# Patient Record
Sex: Male | Born: 1985 | Race: White | Hispanic: No | Marital: Single | Smoking: Former smoker
Health system: Southern US, Community
[De-identification: ages and names within clinical notes are randomized; demographics above are authoritative.]

## PROBLEM LIST (undated history)

## (undated) DIAGNOSIS — F418 Other specified anxiety disorders: Secondary | ICD-10-CM

## (undated) DIAGNOSIS — K219 Gastro-esophageal reflux disease without esophagitis: Secondary | ICD-10-CM

## (undated) DIAGNOSIS — N200 Calculus of kidney: Secondary | ICD-10-CM

## (undated) HISTORY — DX: Other specified anxiety disorders: F41.8

## (undated) HISTORY — DX: Gastro-esophageal reflux disease without esophagitis: K21.9

---
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
## 2008-10-05 ENCOUNTER — Emergency Department (HOSPITAL_COMMUNITY): Admission: EM | Admit: 2008-10-05 | Discharge: 2008-10-05 | Payer: Self-pay | Admitting: Emergency Medicine

## 2010-06-12 LAB — URINALYSIS, ROUTINE W REFLEX MICROSCOPIC
Bilirubin Urine: NEGATIVE
Ketones, ur: 15 mg/dL — AB
Protein, ur: NEGATIVE mg/dL
Urobilinogen, UA: 1 mg/dL (ref 0.0–1.0)

## 2010-06-12 LAB — COMPREHENSIVE METABOLIC PANEL
ALT: 16 U/L (ref 0–53)
Albumin: 4.4 g/dL (ref 3.5–5.2)
Alkaline Phosphatase: 52 U/L (ref 39–117)
Chloride: 105 mEq/L (ref 96–112)
Potassium: 3.5 mEq/L (ref 3.5–5.1)
Sodium: 137 mEq/L (ref 135–145)
Total Bilirubin: 1.1 mg/dL (ref 0.3–1.2)
Total Protein: 7.1 g/dL (ref 6.0–8.3)

## 2010-06-12 LAB — CBC
Hemoglobin: 15.7 g/dL (ref 13.0–17.0)
Platelets: 200 10*3/uL (ref 150–400)
RDW: 12.8 % (ref 11.5–15.5)
WBC: 9.3 10*3/uL (ref 4.0–10.5)

## 2010-06-12 LAB — DIFFERENTIAL
Basophils Relative: 1 % (ref 0–1)
Eosinophils Absolute: 0.2 10*3/uL (ref 0.0–0.7)
Eosinophils Relative: 2 % (ref 0–5)
Monocytes Absolute: 0.8 10*3/uL (ref 0.1–1.0)
Monocytes Relative: 8 % (ref 3–12)

## 2010-06-12 LAB — URINE MICROSCOPIC-ADD ON

## 2010-07-05 HISTORY — PX: FINGER SURGERY: SHX640

## 2010-07-26 ENCOUNTER — Ambulatory Visit (HOSPITAL_BASED_OUTPATIENT_CLINIC_OR_DEPARTMENT_OTHER)
Admission: RE | Admit: 2010-07-26 | Discharge: 2010-07-26 | Disposition: A | Discharge status: Home / Self Care | Payer: Self-pay | Source: Ambulatory Visit | Attending: Orthopedic Surgery | Admitting: Orthopedic Surgery

## 2010-07-26 DIAGNOSIS — Y998 Other external cause status: Secondary | ICD-10-CM | POA: Insufficient documentation

## 2010-07-26 DIAGNOSIS — S65509A Unspecified injury of blood vessel of unspecified finger, initial encounter: Secondary | ICD-10-CM | POA: Insufficient documentation

## 2010-07-26 DIAGNOSIS — F17200 Nicotine dependence, unspecified, uncomplicated: Secondary | ICD-10-CM | POA: Insufficient documentation

## 2010-07-26 DIAGNOSIS — Y9389 Activity, other specified: Secondary | ICD-10-CM | POA: Insufficient documentation

## 2010-07-26 DIAGNOSIS — S61209A Unspecified open wound of unspecified finger without damage to nail, initial encounter: Secondary | ICD-10-CM | POA: Insufficient documentation

## 2010-07-26 DIAGNOSIS — IMO0002 Reserved for concepts with insufficient information to code with codable children: Secondary | ICD-10-CM | POA: Insufficient documentation

## 2010-07-26 DIAGNOSIS — W268XXA Contact with other sharp object(s), not elsewhere classified, initial encounter: Secondary | ICD-10-CM | POA: Insufficient documentation

## 2010-07-27 LAB — POCT HEMOGLOBIN-HEMACUE: Hemoglobin: 15.3 g/dL (ref 13.0–17.0)

## 2010-07-29 NOTE — Op Note (Signed)
NAMESY, SAINTJEAN NO.:  [id number]  MEDICAL RECORD NO.:  [id number]           PATIENT TYPE:  LOCATION:                                 FACILITY:  PHYSICIAN:  Betha Loa, MD             DATE OF BIRTH:  DATE OF PROCEDURE:  07/26/2010 DATE OF DISCHARGE:                              OPERATIVE REPORT   PREOPERATIVE DIAGNOSES:  Left long finger flexor tendon laceration, possible digital nerve and artery laceration.  POSTOPERATIVE DIAGNOSES:  Left long finger flexor digitorum profundus laceration, radial digital nerve and artery lacerations.  PROCEDURE:  Left long finger irrigation and debridement of wound, repair of FDP in zone 2, repair of radial digital nerve and artery lacerations.  SURGEON:  Betha Loa, MD  ASSISTANT:  Cindee Salt, MD  ANESTHESIA:  General with regional IV fluids per anesthesia flow sheet.  ESTIMATED BLOOD LOSS:  Minimal.  COMPLICATIONS:  None.  SPECIMENS:  None.  TOURNIQUET TIME:  69 minutes.  DISPOSITION:  Stable to PACU.  INDICATIONS:  Mr. Pullin is a 25 year old white male who 5 days ago was moving a refrigerator when he cut his left long finger on a piece of sheet metal on the refrigerator.  He was went to the [facility] ED where his wound was irrigated and debrided.  He was felt to have a flexor tendon laceration.  I was consulted for management of injury.  He was placed on Bactrim and followed up with me the following day.  On examination, he had intact capillary refill on the left long fingertip. He had decreased sensation on the radial side and normal sensation on the ulnar side.  He was unable to flex at the DIP joint.  He had weak flexion at the PIP joint.  I recommended to Mr. Hannen going to the operating room for irrigation and debridement of the wound, repair of likely flexor tendon laceration and possible radial digital nerve and artery lacerations.  Risks, benefits and alternatives of surgery were discussed  including the risk of blood loss, infection, damage to nerves, vessels, tendons,  ligaments, bone, failure to procedure, need for additional procedure, complications with wound healing, continued pain, and stiffness.  He voiced understanding these risks and elected to proceed.  OPERATIVE COURSE:  After being identified preoperatively by myself, the patient and I agreed upon procedure and site of procedure.  The surgical site was marked.  The risks, benefits and alternatives of surgery were reviewed and he wished to proceed.  Surgical consent had been signed. He was given 1 g of IV Ancef as preoperative antibiotic prophylaxis.  He was given a regional block by Anesthesia in preoperative holding.  He was transferred to the operating room and placed on the operating room table in supine position with the left upper extremity on arm board. General anesthesia was induced by the anesthesiologist.  Left upper extremity was prepped and draped in normal sterile orthopedic fashion. A surgical pause was performed between the surgeons, Anesthesia, and operating room staff and our agreement is the patient, procedure and site of procedure.  Tourniquet at the proximal aspect of the extremity was inflated to 250 mmHg after exsanguinated of the limb with an Esmarch bandage.  The sutures from the wound had been removed prior to prepping and draping.  The wound was extended in a Brunner fashion.  Care was taken to protect all neurovascular structures throughout the case. There was noted to be a laceration of the FDP tendon as well as through the A4 pulley transversely.  The radial digital nerve was 100% lacerated.  There was a apparent laceration to the artery as well. Attention was turned to the flexor tendon first.  A 6-0 Mersilene suture was used to repair the laceration of the A4 pulley.  The cruciate pulleys at the DIP joint were released.  The proximal aspect of the tendon was able to be brought out  from the flexor sheath proximal to the A4 pulley.  The vinculum was still intact.  A 4-0 Mersilene suture was used in a modified Kessler fashion.  The sutures were thrown in the proximal aspect of the tendon first.  The tendon was then able to be brought back through the flexor sheath, now distal to the A4 pulley. The remaining portion of the Kessler suture was placed in the distal stump of the tendon.  This approximated the tendon edges well.  A second modified Kessler core suture was thrown making a four-strand repair. Again, the tendon edges were approximated well.  A 6-0 Prolene suture was used as a running epitendinous suture on the volar aspect of the tendon.  This kept the tendon edges well approximated without any elephant footing.  Attention was turned to the neurovascular bundle on the radial side.  The microscope was brought in.  Under microscopic examination, the radial nerve was confirmed to be 100% lacerated.  There was a small laceration to the radial digital artery.  This went through the media, but not through the intima.  There was no clot in the artery. A 9-0 nylon suture was used to place a single stitch in the media of the artery to prevent any aneurysmal changes.  The edges of the digital nerve was then approximated using the 9-0 suture as well.  This was done in an interrupted fashion.  Four sutures were placed lightly approximating the edges of the nerve.  The wound was then copiously irrigated with sterile saline.  The skin was repaired with 4-0 nylon in a horizontal mattress fashion.  The wound was then dressed with sterile Xeroform and 4x4s, and wrapped with Kerlix bandage.  A dorsal blocking splint was placed and wrapped with Kerlix and Ace bandage.  The tourniquet was deflated at 69 minutes.  Fingertips were pink with brisk capillary refill after deflation of the tourniquet.  The operative drapes were broken down.  The patient was awakened from  anesthesia safely.  He was transferred back to stretcher and taken to PACU in stable condition.  I will see him back in the office in 1 week for postoperative followup.  He will finish off his Bactrim prescription from last week.  I will give him Percocet 5/325 one to two p.o. q.6 h. p.r.n. pain, dispensed #50.     Betha Loa, MD     KK/MEDQ  D:  07/26/2010  T:  07/27/2010  Job:  [id number]  Electronically Signed by Betha Loa  on 07/29/2010 04:18:57 PM

## 2013-12-27 ENCOUNTER — Encounter (HOSPITAL_COMMUNITY): Payer: Self-pay | Admitting: Emergency Medicine

## 2013-12-27 ENCOUNTER — Emergency Department (HOSPITAL_COMMUNITY)
Admission: EM | Admit: 2013-12-27 | Discharge: 2013-12-27 | Disposition: A | Discharge status: Home / Self Care | Payer: Self-pay | Attending: Emergency Medicine | Admitting: Emergency Medicine

## 2013-12-27 DIAGNOSIS — K047 Periapical abscess without sinus: Secondary | ICD-10-CM | POA: Insufficient documentation

## 2013-12-27 DIAGNOSIS — K029 Dental caries, unspecified: Secondary | ICD-10-CM | POA: Insufficient documentation

## 2013-12-27 DIAGNOSIS — Z720 Tobacco use: Secondary | ICD-10-CM | POA: Insufficient documentation

## 2013-12-27 MED ORDER — TRAMADOL HCL 50 MG PO TABS: 50.0000 mg | ORAL_TABLET | Freq: Four times a day (QID) | ORAL | Status: DC | PRN

## 2013-12-27 MED ORDER — AMOXICILLIN 500 MG PO CAPS: 500.0000 mg | ORAL_CAPSULE | Freq: Three times a day (TID) | ORAL | Status: DC

## 2013-12-27 MED ORDER — AMOXICILLIN 500 MG PO CAPS
500.0000 mg | ORAL_CAPSULE | Freq: Once | ORAL | Status: AC
  Administered 2013-12-27: 500 mg via ORAL
  Filled 2013-12-27: qty 1

## 2013-12-27 MED ORDER — NAPROXEN 500 MG PO TABS: 500.0000 mg | ORAL_TABLET | Freq: Two times a day (BID) | ORAL | Status: DC

## 2013-12-27 NOTE — ED Provider Notes (Signed)
CSN: [id number]     Arrival date & time 12/27/13  0411 History   First MD Initiated Contact with Patient 12/27/13 0545     Chief Complaint  Patient presents with  . Dental Pain     (Consider location/radiation/quality/duration/timing/severity/associated sxs/prior Treatment) HPI Comments: Patient presents with complaint of dental pain. Patient has had right upper dental pain over the past one to 2 weeks. No throat swelling or trouble swallowing. No fever or trouble breathing. [facility] and he states he is going through the process of receiving care there. They had placed him on amoxicillin which he took until approximately one month prior. No other treatments prior to arrival. Onset of symptoms gradual. Course is constant.  Patient is a 28 y.o. male presenting with tooth pain. The history is provided by the patient.  Dental Pain Associated symptoms: no facial swelling, no fever, no headaches and no neck pain     History reviewed. No pertinent past medical history. Past Surgical History  Procedure Laterality Date  . Finger surgery    . Hernia repair     No family history on file. History  Substance Use Topics  . Smoking status: Current Every Day Smoker  . Smokeless tobacco: Not on file  . Alcohol Use: Yes    Review of Systems  Constitutional: Negative for fever.  HENT: Positive for dental problem. Negative for ear pain, facial swelling, sore throat and trouble swallowing.   Respiratory: Negative for shortness of breath and stridor.   Musculoskeletal: Negative for neck pain.  Skin: Negative for color change.  Neurological: Negative for headaches.      Allergies  Review of patient's allergies indicates no known allergies.  Home Medications   Prior to Admission medications   Medication Sig Start Date End Date Taking? Authorizing Provider  ibuprofen (ADVIL,MOTRIN) 200 MG tablet Take 400-600 mg by mouth every 6 (six) hours as needed for  moderate pain.   Yes Historical Provider, MD  Multiple Vitamin (MULTIVITAMIN WITH MINERALS) TABS tablet Take 1 tablet by mouth daily.   Yes Historical Provider, MD  amoxicillin (AMOXIL) 500 MG capsule Take 1 capsule (500 mg total) by mouth 3 (three) times daily. 12/27/13   Renne CriglerJoshua Muneer Leider, PA-C  naproxen (NAPROSYN) 500 MG tablet Take 1 tablet (500 mg total) by mouth 2 (two) times daily. 12/27/13   Renne CriglerJoshua Tereka Thorley, PA-C  traMADol (ULTRAM) 50 MG tablet Take 1 tablet (50 mg total) by mouth every 6 (six) hours as needed. 12/27/13   Renne CriglerJoshua Micaiah Remillard, PA-C   BP 112/57  Pulse 63  Temp(Src) 97.9 F (36.6 C) (Oral)  Resp 16  Ht 5\' 5"  (1.651 m)  Wt 140 lb (63.504 kg)  BMI 23.30 kg/m2  SpO2 99% Physical Exam  Nursing note and vitals reviewed. Constitutional: He appears well-developed and well-nourished.  HENT:  Head: Normocephalic and atraumatic.  Right Ear: Tympanic membrane, external ear and ear canal normal.  Left Ear: Tympanic membrane, external ear and ear canal normal.  Nose: Nose normal.  Mouth/Throat: Uvula is midline, oropharynx is clear and moist and mucous membranes are normal. No trismus in the jaw. Abnormal dentition. Dental caries present. No dental abscesses or uvula swelling. No tonsillar abscesses.  Advanced periodontal disease. Extensive cavities noted. Multiple broken teeth. Patient with R maxillary tooth pain and tenderness to palpation in area of bicuspid. There is a pustule on the gumline in this area.  Eyes: Pupils are equal, round, and reactive to light.  Neck:  Normal range of motion. Neck supple.  No neck swelling or Lugwig's angina  Neurological: He is alert.  Skin: Skin is warm and dry.  Psychiatric: He has a normal mood and affect.    ED Course  Procedures (including critical care time) Labs Review Labs Reviewed - No data to display  Imaging Review No results found.   EKG Interpretation None      7:24 AM Patient seen and examined. Medications ordered.   Vital  signs reviewed and are as follows: BP 112/57  Pulse 63  Temp(Src) 97.9 F (36.6 C) (Oral)  Resp 16  Ht 5\' 5"  (1.651 m)  Wt 140 lb (63.504 kg)  BMI 23.30 kg/m2  SpO2 99%  Patient counseled on use of narcotic pain medications. Counseled not to combine these medications with others containing tylenol. Urged not to drink alcohol, drive, or perform any other activities that requires focus while taking these medications. The patient verbalizes understanding and agrees with the plan.  Patient counseled to take prescribed medications as directed, return with worsening facial or neck swelling, and to follow-up with their dentist as soon as possible.    MDM   Final diagnoses:  Dental abscess   Patient with toothache. No fever. Exam unconcerning for Ludwig's angina or other deep tissue infection in neck.   As there is gum swelling, erythema, and mild facial swelling, will treat with antibiotic and pain medicine. Urged patient to follow-up with dentist.       Renne CriglerJoshua Patrena Santalucia, PA-C 12/27/13 [phone]

## 2013-12-27 NOTE — Discharge Instructions (Signed)
Please read and follow all provided instructions.  Your diagnoses today include:  1. Dental abscess     The exam and treatment you received today has been provided on an emergency basis only. This is not a substitute for complete medical or dental care.  Tests performed today include:  Vital signs. See below for your results today.   Medications prescribed:   Amoxicillin - antibiotic  You have been prescribed an antibiotic medicine: take the entire course of medicine even if you are feeling better. Stopping early can cause the antibiotic not to work.   Tramadol - narcotic-like pain medication  DO NOT drive or perform any activities that require you to be awake and alert because this medicine can make you drowsy.    Naproxen - anti-inflammatory pain medication  Do not exceed 500mg  naproxen every 12 hours, take with food  You have been prescribed an anti-inflammatory medication or NSAID. Take with food. Take smallest effective dose for the shortest duration needed for your pain. Stop taking if you experience stomach pain or vomiting.   Take any prescribed medications only as directed.  Home care instructions:  Follow any educational materials contained in this packet.  Follow-up instructions: Please follow-up with your dentist for further evaluation of your symptoms.   Dental Assistance: See below for dental referrals  Return instructions:   Please return to the Emergency Department if you experience worsening symptoms.  Please return if you develop a fever, you develop more swelling in your face or neck, you have trouble breathing or swallowing food.  Please return if you have any other emergent concerns.  Additional Information:  Your vital signs today were: BP 112/57   Pulse 63   Temp(Src) 97.9 F (36.6 C) (Oral)   Resp 16   Ht 5\' 5"  (1.651 m)   Wt 140 lb (63.504 kg)   BMI 23.30 kg/m2   SpO2 99% If your blood pressure (BP) was elevated above 135/85 this visit,  please have this repeated by your doctor within one month. -------------- Dental Care: Organization         Address  Phone  Notes  [facility] [facility] [address], [place] [phone] Accepts children up to age 26 who are enrolled in [place] or [place] Health Choice; pregnant women with a Medicaid card; and children who have applied for Medicaid or [place] Health Choice, but were declined, whose parents can pay a reduced fee at time of service.  [facility] [place]  [address], [place] [phone] Accepts children up to age 26 who are enrolled in [place] or [place] Health Choice; pregnant women with a Medicaid card; and children who have applied for Medicaid or [place] Health Choice, but were declined, whose parents can pay a reduced fee at time of service.  [facility]  [address], [place] [phone] Patients are seen by appointment only. Walk-ins are not accepted. [facility] will see patients 418 years of age and older. Monday - Tuesday (8am-5pm) Most Wednesdays (8:30-5pm) $30 per visit, cash only  [facility]  [address], [facility] [phone] Patients are seen by appointment only. Walk-ins are not accepted. [facility] will see patients 26 years of age and older. One Wednesday Evening (Monthly: Volunteer Based).  $30 per visit, cash only  [facility]  [phone] for adults; Children  under age 524, call Graduate Pediatric Dentistry at [phone]. Children aged 754-14, please call [phone] to request a pediatric application.  Dental services are provided in all areas of dental care including fillings, crowns and bridges, complete and partial dentures, implants, gum treatment, root canals, and extractions. Preventive care is also provided. Treatment is provided to both adults and  children. Patients are selected via a lottery and there is often a waiting list.   [facility] [address], [place]  [phone] [email or web address]   [facility] [address], [place], [place] [phone], Ext. [phone] Second and Fourth Thursday of each month, opens at 6:30 AM; Clinic ends at 9 AM.  Patients are seen on a first-come first-served basis, and a limited number are seen during each clinic.   [facility]  [address], [place], [place] [phone]   Eligibility Requirements You must have lived in [place], [place], or [place] for at least the last three months.   You cannot be eligible for state or federal sponsored [place]care insurance, including [employer], [place], or [employer]icare.   You generally cannot be eligible for healthcare insurance through your employer.    How to apply: Eligibility screenings are held every Tuesday and Wednesday afternoon from 1:00 pm until 4:00 pm. You do not need an appointment for the interview!  [facility] [address], [place], [place] [phone]   [facility]  [phone]   [facility]  [phone]   [facility]  [phone]

## 2013-12-27 NOTE — ED Notes (Signed)
Bed: WTR8 Expected date:  Expected time:  Means of arrival:  Comments: 

## 2013-12-27 NOTE — ED Provider Notes (Signed)
Medical screening examination/treatment/procedure(s) were performed by non-physician practitioner and as supervising physician I was immediately available for consultation/collaboration.   EKG Interpretation None        Rolan BuccoMelanie Dontavis Tschantz, MD 12/27/13 [phone]

## 2013-12-27 NOTE — ED Notes (Signed)
Pt c/o R upper gum/dental pain x 1-2 weeks. Pt states [facility] now notes a "puss pocket" on gum with severe pain.

## 2015-02-04 ENCOUNTER — Encounter: Payer: Self-pay | Admitting: Internal Medicine

## 2015-03-11 ENCOUNTER — Encounter: Payer: Self-pay | Admitting: *Deleted

## 2015-04-06 ENCOUNTER — Ambulatory Visit: Payer: Self-pay | Admitting: Internal Medicine

## 2015-04-12 ENCOUNTER — Ambulatory Visit (HOSPITAL_BASED_OUTPATIENT_CLINIC_OR_DEPARTMENT_OTHER): Payer: Self-pay

## 2016-07-31 ENCOUNTER — Encounter (HOSPITAL_COMMUNITY): Payer: Self-pay | Admitting: Family Medicine

## 2016-07-31 ENCOUNTER — Emergency Department (HOSPITAL_COMMUNITY)
Admission: EM | Admit: 2016-07-31 | Discharge: 2016-08-01 | Disposition: A | Discharge status: Home / Self Care | Payer: Self-pay | Attending: Emergency Medicine | Admitting: Emergency Medicine

## 2016-07-31 ENCOUNTER — Emergency Department (HOSPITAL_COMMUNITY): Payer: Self-pay

## 2016-07-31 DIAGNOSIS — R112 Nausea with vomiting, unspecified: Secondary | ICD-10-CM | POA: Insufficient documentation

## 2016-07-31 DIAGNOSIS — Z79899 Other long term (current) drug therapy: Secondary | ICD-10-CM | POA: Insufficient documentation

## 2016-07-31 DIAGNOSIS — N200 Calculus of kidney: Secondary | ICD-10-CM

## 2016-07-31 DIAGNOSIS — N132 Hydronephrosis with renal and ureteral calculous obstruction: Secondary | ICD-10-CM | POA: Insufficient documentation

## 2016-07-31 DIAGNOSIS — R1110 Vomiting, unspecified: Secondary | ICD-10-CM

## 2016-07-31 DIAGNOSIS — F17210 Nicotine dependence, cigarettes, uncomplicated: Secondary | ICD-10-CM | POA: Insufficient documentation

## 2016-07-31 HISTORY — DX: Calculus of kidney: N20.0

## 2016-07-31 LAB — COMPREHENSIVE METABOLIC PANEL
ALT: 16 U/L — AB (ref 17–63)
AST: 21 U/L (ref 15–41)
Albumin: 4.4 g/dL (ref 3.5–5.0)
Alkaline Phosphatase: 60 U/L (ref 38–126)
Anion gap: 11 (ref 5–15)
BILIRUBIN TOTAL: 0.4 mg/dL (ref 0.3–1.2)
BUN: 23 mg/dL — ABNORMAL HIGH (ref 6–20)
CHLORIDE: 106 mmol/L (ref 101–111)
CO2: 22 mmol/L (ref 22–32)
CREATININE: 1.12 mg/dL (ref 0.61–1.24)
Calcium: 9.5 mg/dL (ref 8.9–10.3)
Glucose, Bld: 109 mg/dL — ABNORMAL HIGH (ref 65–99)
Potassium: 3.5 mmol/L (ref 3.5–5.1)
Sodium: 139 mmol/L (ref 135–145)
TOTAL PROTEIN: 7.5 g/dL (ref 6.5–8.1)

## 2016-07-31 LAB — CBC
HCT: 45.1 % (ref 39.0–52.0)
Hemoglobin: 16 g/dL (ref 13.0–17.0)
MCH: 29.5 pg (ref 26.0–34.0)
MCHC: 35.5 g/dL (ref 30.0–36.0)
MCV: 83.1 fL (ref 78.0–100.0)
PLATELETS: 284 10*3/uL (ref 150–400)
RBC: 5.43 MIL/uL (ref 4.22–5.81)
RDW: 13.2 % (ref 11.5–15.5)
WBC: 10.1 10*3/uL (ref 4.0–10.5)

## 2016-07-31 LAB — URINALYSIS, ROUTINE W REFLEX MICROSCOPIC
BILIRUBIN URINE: NEGATIVE
Bacteria, UA: NONE SEEN
GLUCOSE, UA: NEGATIVE mg/dL
KETONES UR: 5 mg/dL — AB
NITRITE: NEGATIVE
PH: 7 (ref 5.0–8.0)
Protein, ur: 30 mg/dL — AB
SPECIFIC GRAVITY, URINE: 1.029 (ref 1.005–1.030)

## 2016-07-31 LAB — LIPASE, BLOOD: LIPASE: 28 U/L (ref 11–51)

## 2016-07-31 LAB — RAPID URINE DRUG SCREEN, HOSP PERFORMED
Amphetamines: NOT DETECTED
BARBITURATES: NOT DETECTED
Benzodiazepines: NOT DETECTED
Cocaine: NOT DETECTED
Opiates: NOT DETECTED
TETRAHYDROCANNABINOL: POSITIVE — AB

## 2016-07-31 MED ORDER — ONDANSETRON HCL 4 MG/2ML IJ SOLN
4.0000 mg | Freq: Once | INTRAMUSCULAR | Status: AC
  Administered 2016-07-31: 4 mg via INTRAVENOUS
  Filled 2016-07-31: qty 2

## 2016-07-31 MED ORDER — FENTANYL CITRATE (PF) 100 MCG/2ML IJ SOLN
50.0000 ug | INTRAMUSCULAR | Status: AC | PRN
  Administered 2016-07-31 (×2): 50 ug via INTRAVENOUS
  Filled 2016-07-31 (×2): qty 2

## 2016-07-31 MED ORDER — ONDANSETRON 4 MG PO TBDP
4.0000 mg | ORAL_TABLET | Freq: Once | ORAL | Status: AC | PRN
  Administered 2016-07-31: 4 mg via ORAL
  Filled 2016-07-31: qty 1

## 2016-07-31 MED ORDER — KETOROLAC TROMETHAMINE 30 MG/ML IJ SOLN
30.0000 mg | Freq: Once | INTRAMUSCULAR | Status: AC
  Administered 2016-07-31: 30 mg via INTRAVENOUS
  Filled 2016-07-31: qty 1

## 2016-07-31 MED ORDER — SODIUM CHLORIDE 0.9 % IV BOLUS (SEPSIS)
1000.0000 mL | Freq: Once | INTRAVENOUS | Status: AC
  Administered 2016-07-31: 1000 mL via INTRAVENOUS

## 2016-07-31 NOTE — ED Triage Notes (Signed)
Patient is complaining of right lower abd pain that started last night with increased urinary urgency. Pt has had nausea, vomiting with no fever.

## 2016-07-31 NOTE — ED Notes (Signed)
ED Provider at bedside. 

## 2016-07-31 NOTE — ED Notes (Signed)
Pt aware of need for urine sample.  

## 2016-07-31 NOTE — ED Provider Notes (Signed)
[facility]-EMERGENCY DEPT Provider Note   CSN: [id number] Arrival date & time: 07/31/16  2053  By signing my name below, I, Doreatha Martin, attest that this documentation has been prepared under the direction and in the presence of Johnmichael Melhorn, MD. Electronically Signed: Doreatha Martin, ED Scribe. 07/31/16. 11:17 PM.     History   Chief Complaint Chief Complaint  Patient presents with  . Abdominal Pain    HPI Carl Brown is a 31 y.o. male who presents to the Emergency Department complaining of moderate, waxing and waning RLQ pain with radiation to the mid lower abdomen that began last night and worsened tonight. Pt reports associated nausea and vomiting that began tonight. He has tried advil with no relief. Pt reports h/o renal calculi, which he last had 4 years ago and states his current symptoms are similar. He was seen by UC yesterday and was given possible dx of kidney stones, but did not receive any medications. He has h/o hernia repair. He denies diarrhea, dark urine, additional complaints.   The history is provided by the patient. No language interpreter was used.  Abdominal Pain   This is a new problem. The current episode started yesterday. The problem occurs constantly. The problem has been gradually worsening. The pain is located in the RLQ. The pain is moderate. Associated symptoms include nausea and vomiting. Pertinent negatives include fever and diarrhea. Nothing aggravates the symptoms. Nothing relieves the symptoms. Past workup includes surgery. Past medical history comments: renal calculi.    Past Medical History:  Diagnosis Date  . Depression with anxiety   . GERD (gastroesophageal reflux disease)   . Kidney stone     There are no active problems to display for this patient.   Past Surgical History:  Procedure Laterality Date  . FINGER SURGERY  07/2010  . HERNIA REPAIR  03/1994  . TONSILLECTOMY  03/1997       Home Medications    Prior to Admission medications     Medication Sig Start Date End Date Taking? Authorizing Provider  ibuprofen (ADVIL,MOTRIN) 200 MG tablet Take 400-600 mg by mouth every 6 (six) hours as needed for moderate pain.   Yes [provider]  omeprazole (PRILOSEC OTC) 20 MG tablet Take 20 mg by mouth daily.   Yes [provider]  amoxicillin (AMOXIL) 500 MG capsule Take 1 capsule (500 mg total) by mouth 3 (three) times daily. Patient not taking: Reported on 07/31/2016 12/27/13   Renne Crigler, PA-C  naproxen (NAPROSYN) 500 MG tablet Take 1 tablet (500 mg total) by mouth 2 (two) times daily. Patient not taking: Reported on 07/31/2016 12/27/13   Renne Crigler, PA-C  traMADol (ULTRAM) 50 MG tablet Take 1 tablet (50 mg total) by mouth every 6 (six) hours as needed. Patient not taking: Reported on 07/31/2016 12/27/13   Renne Crigler, PA-C    Family History Family History  Problem Relation Age of Onset  . Lung cancer Mother     Social History Social History  Substance Use Topics  . Smoking status: Current Every Day Smoker    Packs/day: 1.00    Types: Cigarettes  . Smokeless tobacco: Never Used  . Alcohol use 0.0 oz/week     Comment: Twice a month     Allergies   Patient has no known allergies.   Review of Systems Review of Systems  Constitutional: Negative for chills and fever.  HENT: Negative for drooling and facial swelling.   Eyes: Negative for photophobia.  Respiratory: Negative for  shortness of breath.   Cardiovascular: Negative for chest pain, palpitations and leg swelling.  Gastrointestinal: Positive for abdominal pain, nausea and vomiting. Negative for anal bleeding and diarrhea.  Genitourinary: Negative for difficulty urinating.  Musculoskeletal: Negative for neck stiffness.  Skin: Negative for pallor.  Neurological: Negative for facial asymmetry and speech difficulty.  Psychiatric/Behavioral: Negative for suicidal ideas.  All other systems reviewed and are negative.    Physical  Exam Updated Vital Signs BP 122/77 (BP Location: Right Arm)   Pulse 61   Temp 97.6 F (36.4 C) (Oral)   Resp 16   Ht 5\' 6"  (1.676 m)   Wt 150 lb (68 kg)   SpO2 100%   BMI 24.21 kg/m   Physical Exam  Constitutional: He is oriented to person, place, and time. He appears well-developed and well-nourished.  HENT:  Head: Normocephalic and atraumatic.  Mouth/Throat: Oropharynx is clear and moist. No oropharyngeal exudate.  Moist mucous membranes. No exudates.   Eyes: Conjunctivae and EOM are normal. Pupils are equal, round, and reactive to light.  Neck: Normal range of motion. Neck supple. No JVD present. No tracheal deviation present.  No carotid bruits. Trachea midline.   Cardiovascular: Normal rate, regular rhythm, normal heart sounds and intact distal pulses.  Exam reveals no gallop and no friction rub.   No murmur heard. RRR.   Pulmonary/Chest: Effort normal and breath sounds normal. No stridor. No respiratory distress. He has no wheezes. He has no rales.  Lungs CTA bilaterally.   Abdominal: Soft. Bowel sounds are normal. He exhibits no distension. There is no rebound and no guarding.  Musculoskeletal: Normal range of motion.  Lymphadenopathy:    He has no cervical adenopathy.  Neurological: He is alert and oriented to person, place, and time. He has normal reflexes. He displays normal reflexes.  Skin: Skin is warm and dry. Capillary refill takes less than 2 seconds.  Psychiatric: He has a normal mood and affect.  Nursing note and vitals reviewed.    ED Treatments / Results   Vitals:   07/31/16 2321 08/01/16 0144  BP: 122/77 124/65  Pulse: 61 64  Resp: 16 16  Temp:     DIAGNOSTIC STUDIES: Oxygen Saturation is 100% on RA, normal by my interpretation.    COORDINATION OF CARE: 11:06 PM Discussed treatment plan with pt at bedside which includes labs and pt agreed to plan.    Labs (all labs ordered are listed, but only abnormal results are displayed)  Results for  orders placed or performed during the hospital encounter of 07/31/16  Lipase, blood  Result Value Ref Range   Lipase 28 11 - 51 U/L  Comprehensive metabolic panel  Result Value Ref Range   Sodium 139 135 - 145 mmol/L   Potassium 3.5 3.5 - 5.1 mmol/L   Chloride 106 101 - 111 mmol/L   CO2 22 22 - 32 mmol/L   Glucose, Bld 109 (H) 65 - 99 mg/dL   BUN 23 (H) 6 - 20 mg/dL   Creatinine, Ser 1.61 0.61 - 1.24 mg/dL   Calcium 9.5 8.9 - 09.6 mg/dL   Total Protein 7.5 6.5 - 8.1 g/dL   Albumin 4.4 3.5 - 5.0 g/dL   AST 21 15 - 41 U/L   ALT 16 (L) 17 - 63 U/L   Alkaline Phosphatase 60 38 - 126 U/L   Total Bilirubin 0.4 0.3 - 1.2 mg/dL   GFR calc non Af Amer >60 >60 mL/min   GFR calc Af Amer >  60 >60 mL/min   Anion gap 11 5 - 15  CBC  Result Value Ref Range   WBC 10.1 4.0 - 10.5 K/uL   RBC 5.43 4.22 - 5.81 MIL/uL   Hemoglobin 16.0 13.0 - 17.0 g/dL   HCT 16.145.1 09.639.0 - 04.552.0 %   MCV 83.1 78.0 - 100.0 fL   MCH 29.5 26.0 - 34.0 pg   MCHC 35.5 30.0 - 36.0 g/dL   RDW 40.913.2 81.111.5 - 91.415.5 %   Platelets 284 150 - 400 K/uL  Urinalysis, Routine w reflex microscopic  Result Value Ref Range   Color, Urine YELLOW YELLOW   APPearance CLEAR CLEAR   Specific Gravity, Urine 1.029 1.005 - 1.030   pH 7.0 5.0 - 8.0   Glucose, UA NEGATIVE NEGATIVE mg/dL   Hgb urine dipstick MODERATE (A) NEGATIVE   Bilirubin Urine NEGATIVE NEGATIVE   Ketones, ur 5 (A) NEGATIVE mg/dL   Protein, ur 30 (A) NEGATIVE mg/dL   Nitrite NEGATIVE NEGATIVE   Leukocytes, UA MODERATE (A) NEGATIVE   RBC / HPF 0-5 0 - 5 RBC/hpf   WBC, UA 0-5 0 - 5 WBC/hpf   Bacteria, UA NONE SEEN NONE SEEN   Squamous Epithelial / LPF 0-5 (A) NONE SEEN   Mucous PRESENT   Rapid urine drug screen (hospital performed)  Result Value Ref Range   Opiates NONE DETECTED NONE DETECTED   Cocaine NONE DETECTED NONE DETECTED   Benzodiazepines NONE DETECTED NONE DETECTED   Amphetamines NONE DETECTED NONE DETECTED   Tetrahydrocannabinol POSITIVE (A) NONE DETECTED    Barbiturates NONE DETECTED NONE DETECTED   Ct Renal Stone Study  Result Date: 08/01/2016 CLINICAL DATA:  RIGHT lower quadrant pain, nausea and vomiting. History of kidney stones and RIGHT inguinal hernia repair. EXAM: CT ABDOMEN AND PELVIS WITHOUT CONTRAST TECHNIQUE: Multidetector CT imaging of the abdomen and pelvis was performed following the standard protocol without IV contrast. COMPARISON:  CT abdomen and pelvis October 05, 2008 FINDINGS: LOWER CHEST: Lung bases are clear. The visualized heart size is normal. No pericardial effusion. Mild gas distended distal esophagus associated with reflux. HEPATOBILIARY: Normal. PANCREAS: Normal. SPLEEN: Normal. ADRENALS/URINARY TRACT: Kidneys are orthotopic, demonstrating normal size and morphology. Mild RIGHT hydroureteronephrosis to the distal ureter where a 2 mm calculus is present. No nephrolithiasis ; limited assessment for renal masses on this nonenhanced examination. The unopacified ureters are normal in course and caliber. Urinary bladder is partially distended and unremarkable. Normal adrenal glands. STOMACH/BOWEL: The stomach, small and large bowel are normal in course and caliber without inflammatory changes, sensitivity decreased by lack of enteric contrast. Normal appendix. VASCULAR/LYMPHATIC: Aortoiliac vessels are normal in course and caliber. No lymphadenopathy by CT size criteria. REPRODUCTIVE: Normal. OTHER: No intraperitoneal free fluid or free air. Phleboliths LEFT pelvis. MUSCULOSKELETAL: Non-acute.  No inguinal hernia. IMPRESSION: 2 mm distal RIGHT ureteral calculus resulting in mild obstructive uropathy. No residual nephrolithiasis. Normal appendix. Electronically Signed   By: Awilda Metroourtnay  Bloomer M.D.   On: 08/01/2016 00:21    Radiology Ct Renal Stone Study  Result Date: 08/01/2016 CLINICAL DATA:  RIGHT lower quadrant pain, nausea and vomiting. History of kidney stones and RIGHT inguinal hernia repair. EXAM: CT ABDOMEN AND PELVIS WITHOUT  CONTRAST TECHNIQUE: Multidetector CT imaging of the abdomen and pelvis was performed following the standard protocol without IV contrast. COMPARISON:  CT abdomen and pelvis October 05, 2008 FINDINGS: LOWER CHEST: Lung bases are clear. The visualized heart size is normal. No pericardial effusion. Mild gas distended distal esophagus associated  with reflux. HEPATOBILIARY: Normal. PANCREAS: Normal. SPLEEN: Normal. ADRENALS/URINARY TRACT: Kidneys are orthotopic, demonstrating normal size and morphology. Mild RIGHT hydroureteronephrosis to the distal ureter where a 2 mm calculus is present. No nephrolithiasis ; limited assessment for renal masses on this nonenhanced examination. The unopacified ureters are normal in course and caliber. Urinary bladder is partially distended and unremarkable. Normal adrenal glands. STOMACH/BOWEL: The stomach, small and large bowel are normal in course and caliber without inflammatory changes, sensitivity decreased by lack of enteric contrast. Normal appendix. VASCULAR/LYMPHATIC: Aortoiliac vessels are normal in course and caliber. No lymphadenopathy by CT size criteria. REPRODUCTIVE: Normal. OTHER: No intraperitoneal free fluid or free air. Phleboliths LEFT pelvis. MUSCULOSKELETAL: Non-acute.  No inguinal hernia. IMPRESSION: 2 mm distal RIGHT ureteral calculus resulting in mild obstructive uropathy. No residual nephrolithiasis. Normal appendix. Electronically Signed   By: Awilda Metro M.D.   On: 08/01/2016 00:21    Procedures Procedures (including critical care time)  Medications Ordered in ED Medications  magnesium sulfate IVPB 2 g 50 mL (2 g Intravenous New Bag/Given 08/01/16 0058)  tamsulosin (FLOMAX) capsule 0.4 mg (0.4 mg Oral Given 08/01/16 0100)  ondansetron (ZOFRAN-ODT) disintegrating tablet 4 mg (4 mg Oral Given 07/31/16 2118)  fentaNYL (SUBLIMAZE) injection 50 mcg (50 mcg Intravenous Given 07/31/16 2216)  ondansetron (ZOFRAN) injection 4 mg (4 mg Intravenous Given  07/31/16 2146)  ketorolac (TORADOL) 30 MG/ML injection 30 mg (30 mg Intravenous Given 07/31/16 2333)  sodium chloride 0.9 % bolus 1,000 mL (0 mLs Intravenous Stopped 08/01/16 0058)      Final Clinical Impressions(s) / ED Diagnoses  Kidney stones:  Return immediately for fever >101, intractable pain or vomiting, bleeding or any concerns. Follow up with urology for ongoing concerns. scedule an appointment in 1 week.   The patient is nontoxic-appearing on exam and vital signs are within normal limits.   I have reviewed the triage vital signs and the nursing notes. Pertinent labs &imaging results that were available during my care of the patient were reviewed by me and considered in my medical decision making (see chart for details).  After history, exam, and medical workup I feel the patient has been appropriately medically screened and is safe for discharge home. Pertinent diagnoses were discussed with the patient. Patient was given return precautions.   I personally performed the services described in this documentation, which was scribed in my presence. The recorded information has been reviewed and is accurate.      Jeron Grahn, MD 08/01/16 [phone]

## 2016-07-31 NOTE — ED Notes (Signed)
Pt not able to urinate.   

## 2016-08-01 ENCOUNTER — Encounter (HOSPITAL_COMMUNITY): Payer: Self-pay | Admitting: Emergency Medicine

## 2016-08-01 MED ORDER — OXYCODONE-ACETAMINOPHEN 5-325 MG PO TABS: 1.0000 | ORAL_TABLET | Freq: Four times a day (QID) | ORAL | 0 refills | Status: DC | PRN

## 2016-08-01 MED ORDER — MAGNESIUM SULFATE 2 GM/50ML IV SOLN
2.0000 g | Freq: Once | INTRAVENOUS | Status: AC
  Administered 2016-08-01: 2 g via INTRAVENOUS
  Filled 2016-08-01: qty 50

## 2016-08-01 MED ORDER — ONDANSETRON 8 MG PO TBDP: ORAL_TABLET | ORAL | 0 refills | Status: DC

## 2016-08-01 MED ORDER — TAMSULOSIN HCL 0.4 MG PO CAPS
0.4000 mg | ORAL_CAPSULE | Freq: Every day | ORAL | Status: DC
  Administered 2016-08-01: 0.4 mg via ORAL
  Filled 2016-08-01: qty 1

## 2016-08-01 MED ORDER — TAMSULOSIN HCL 0.4 MG PO CAPS: 0.4000 mg | ORAL_CAPSULE | Freq: Every day | ORAL | 0 refills | Status: DC

## 2016-08-01 NOTE — ED Notes (Signed)
Pt tolerated PO liquid with no issues. MD made aware.

## 2016-08-01 NOTE — ED Notes (Signed)
MD at bedside. 

## 2021-06-27 ENCOUNTER — Encounter (HOSPITAL_COMMUNITY): Payer: Self-pay | Admitting: Emergency Medicine

## 2021-06-27 ENCOUNTER — Other Ambulatory Visit: Payer: Self-pay

## 2021-06-27 ENCOUNTER — Emergency Department (HOSPITAL_COMMUNITY): Payer: Self-pay

## 2021-06-27 ENCOUNTER — Emergency Department (HOSPITAL_COMMUNITY)
Admission: EM | Admit: 2021-06-27 | Discharge: 2021-06-28 | Disposition: A | Discharge status: Home / Self Care | Payer: Self-pay | Attending: Emergency Medicine | Admitting: Emergency Medicine

## 2021-06-27 DIAGNOSIS — R079 Chest pain, unspecified: Secondary | ICD-10-CM | POA: Insufficient documentation

## 2021-06-27 DIAGNOSIS — N200 Calculus of kidney: Secondary | ICD-10-CM | POA: Insufficient documentation

## 2021-06-27 LAB — CBC
HCT: 46.4 % (ref 39.0–52.0)
Hemoglobin: 16.3 g/dL (ref 13.0–17.0)
MCH: 29.5 pg (ref 26.0–34.0)
MCHC: 35.1 g/dL (ref 30.0–36.0)
MCV: 83.9 fL (ref 80.0–100.0)
Platelets: 369 K/uL (ref 150–400)
RBC: 5.53 MIL/uL (ref 4.22–5.81)
RDW: 13.2 % (ref 11.5–15.5)
WBC: 17.8 K/uL — ABNORMAL HIGH (ref 4.0–10.5)
nRBC: 0 % (ref 0.0–0.2)

## 2021-06-27 LAB — COMPREHENSIVE METABOLIC PANEL
ALT: 26 U/L (ref 0–44)
AST: 22 U/L (ref 15–41)
Albumin: 5.1 g/dL — ABNORMAL HIGH (ref 3.5–5.0)
Alkaline Phosphatase: 66 U/L (ref 38–126)
Anion gap: 14 (ref 5–15)
BUN: 23 mg/dL — ABNORMAL HIGH (ref 6–20)
CO2: 18 mmol/L — ABNORMAL LOW (ref 22–32)
Calcium: 10.1 mg/dL (ref 8.9–10.3)
Chloride: 109 mmol/L (ref 98–111)
Creatinine, Ser: 1.26 mg/dL — ABNORMAL HIGH (ref 0.61–1.24)
GFR, Estimated: >60 mL/min (ref >60)
Glucose, Bld: 148 mg/dL — ABNORMAL HIGH (ref 70–99)
Potassium: 3.3 mmol/L — ABNORMAL LOW (ref 3.5–5.1)
Sodium: 141 mmol/L (ref 135–145)
Total Bilirubin: 1.7 mg/dL — ABNORMAL HIGH (ref 0.3–1.2)
Total Protein: 8.6 g/dL — ABNORMAL HIGH (ref 6.5–8.1)

## 2021-06-27 LAB — URINALYSIS, ROUTINE W REFLEX MICROSCOPIC
Bacteria, UA: NONE SEEN
Bilirubin Urine: NEGATIVE
Glucose, UA: NEGATIVE mg/dL
Ketones, ur: 80 mg/dL — AB
Leukocytes,Ua: NEGATIVE
Nitrite: NEGATIVE
Protein, ur: 100 mg/dL — AB
RBC / HPF: >50 RBC/hpf — ABNORMAL HIGH (ref 0–5)
Specific Gravity, Urine: 1.028 (ref 1.005–1.030)
pH: 5 (ref 5.0–8.0)

## 2021-06-27 LAB — TROPONIN I (HIGH SENSITIVITY): Troponin I (High Sensitivity): 2 ng/L (ref <18)

## 2021-06-27 MED ORDER — KETOROLAC TROMETHAMINE 15 MG/ML IJ SOLN
15.0000 mg | Freq: Once | INTRAMUSCULAR | Status: AC
  Administered 2021-06-27: 15 mg via INTRAVENOUS
  Filled 2021-06-27: qty 1

## 2021-06-27 MED ORDER — SODIUM CHLORIDE 0.9 % IV BOLUS
1000.0000 mL | Freq: Once | INTRAVENOUS | Status: AC
  Administered 2021-06-27: 1000 mL via INTRAVENOUS

## 2021-06-27 MED ORDER — HYDROMORPHONE HCL 1 MG/ML IJ SOLN
0.5000 mg | Freq: Once | INTRAMUSCULAR | Status: AC
  Administered 2021-06-27: 0.5 mg via INTRAVENOUS
  Filled 2021-06-27: qty 1

## 2021-06-27 MED ORDER — ONDANSETRON HCL 4 MG/2ML IJ SOLN
4.0000 mg | Freq: Once | INTRAMUSCULAR | Status: AC
  Administered 2021-06-27: 4 mg via INTRAVENOUS
  Filled 2021-06-27: qty 2

## 2021-06-27 NOTE — ED Provider Notes (Signed)
?[facility]-EMERGENCY DEPT ?Provider Note ? ? ?CSN: [id number] ?Arrival date & time: 06/27/21  2026 ? ?  ? ?History ? ?Chief Complaint  ?Patient presents with  ? Flank Pain  ? Chest Pain  ? ? ?Carl Brown is a 36 y.o. male. ? ? ?Flank Pain ?Associated symptoms include chest pain. Pertinent negatives include no abdominal pain and no shortness of breath.  ?Chest Pain ?Associated symptoms: nausea and vomiting   ?Associated symptoms: no abdominal pain, no shortness of breath and no weakness   ?Patient with left chest pain flank pain.  Feels like previous kidney stones.  Some dysuria.  No fevers.  Has had vomiting.  States he feels very dehydrated.  Had some chest pain.  States he thinks that it could have been panic due to feeling bad. ?  ? ?Home Medications ?Prior to Admission medications   ?Medication Sig Start Date End Date Taking? Authorizing Provider  ?ondansetron (ZOFRAN-ODT) 4 MG disintegrating tablet Take 1 tablet (4 mg total) by mouth every 8 (eight) hours as needed for nausea or vomiting. 06/28/21  Yes Benjiman Core, MD  ?oxyCODONE-acetaminophen (PERCOCET/ROXICET) 5-325 MG tablet Take 1-2 tablets by mouth every 8 (eight) hours as needed for severe pain. 06/28/21  Yes Benjiman Core, MD  ?amoxicillin (AMOXIL) 500 MG capsule Take 1 capsule (500 mg total) by mouth 3 (three) times daily. ?Patient not taking: Reported on 07/31/2016 12/27/13   Renne Crigler, PA-C  ?ibuprofen (ADVIL,MOTRIN) 200 MG tablet Take 400-600 mg by mouth every 6 (six) hours as needed for moderate pain.    [provider]  ?naproxen (NAPROSYN) 500 MG tablet Take 1 tablet (500 mg total) by mouth 2 (two) times daily. ?Patient not taking: Reported on 07/31/2016 12/27/13   Renne Crigler, PA-C  ?omeprazole (PRILOSEC OTC) 20 MG tablet Take 20 mg by mouth daily.    [provider]  ?   ? ?Allergies    ?Patient has no known allergies.   ? ?Review of Systems   ?Review of Systems  ?Constitutional:  Negative for  appetite change.  ?HENT:  Negative for congestion.   ?Respiratory:  Negative for shortness of breath.   ?Cardiovascular:  Positive for chest pain.  ?Gastrointestinal:  Positive for nausea and vomiting. Negative for abdominal pain.  ?Genitourinary:  Positive for flank pain.  ?Neurological:  Negative for weakness.  ? ?Physical Exam ?Updated Vital Signs ?BP 122/70   Pulse 60   Temp (!) 97.3 ?F (36.3 ?C) (Oral)   Resp 19   Ht 5\' 5"  (1.651 m)   Wt 77.1 kg   SpO2 100%   BMI 28.29 kg/m?  ?Physical Exam ?Vitals and nursing note reviewed.  ?Cardiovascular:  ?   Rate and Rhythm: Regular rhythm.  ?Pulmonary:  ?   Breath sounds: No decreased breath sounds or wheezing.  ?Chest:  ?   Chest wall: No tenderness.  ?Abdominal:  ?   Tenderness: There is no abdominal tenderness.  ?Musculoskeletal:  ?   Cervical back: Neck supple.  ?   Right lower leg: No edema.  ?   Left lower leg: No edema.  ?   Comments: Some CVA tenderness on left.  ?Skin: ?   Capillary Refill: Capillary refill takes less than 2 seconds.  ?Neurological:  ?   Mental Status: He is alert.  ? ? ?ED Results / Procedures / Treatments   ?Labs ?(all labs ordered are listed, but only abnormal results are displayed) ?Labs Reviewed  ?CBC - Abnormal; Notable for the  following components:  ?    Result Value  ? WBC 17.8 (*)   ? All other components within normal limits  ?COMPREHENSIVE METABOLIC PANEL - Abnormal; Notable for the following components:  ? Potassium 3.3 (*)   ? CO2 18 (*)   ? Glucose, Bld 148 (*)   ? BUN 23 (*)   ? Creatinine, Ser 1.26 (*)   ? Total Protein 8.6 (*)   ? Albumin 5.1 (*)   ? Total Bilirubin 1.7 (*)   ? All other components within normal limits  ?URINALYSIS, ROUTINE W REFLEX MICROSCOPIC - Abnormal; Notable for the following components:  ? Color, Urine AMBER (*)   ? APPearance HAZY (*)   ? Hgb urine dipstick MODERATE (*)   ? Ketones, ur 80 (*)   ? Protein, ur 100 (*)   ? RBC / HPF >50 (*)   ? All other components within normal limits  ?TROPONIN I  (HIGH SENSITIVITY)  ?TROPONIN I (HIGH SENSITIVITY)  ? ? ?EKG ?EKG Interpretation ? ?Date/Time:  Monday June 27 2021 20:48:43 EDT ?Ventricular Rate:  84 ?PR Interval:  148 ?QRS Duration: 90 ?QT Interval:  397 ?QTC Calculation: 470 ?R Axis:   87 ?Text Interpretation: Sinus rhythm Borderline prolonged QT interval Baseline wander No old tracing to compare Confirmed by Susy Frizzle [phone]) on 06/28/2021 9:37:10 AM ? ?Radiology ?DG Chest 2 View ? ?Result Date: 06/27/2021 ?CLINICAL DATA:  Chest pain, left flank pain. EXAM: CHEST - 2 VIEW COMPARISON:  12/13/2016. FINDINGS: The heart size and mediastinal contours are within normal limits. No consolidation, effusion, or pneumothorax. No acute osseous abnormality. IMPRESSION: No active cardiopulmonary disease. Electronically Signed   By: Thornell Sartorius M.D.   On: 06/27/2021 21:04  ? ?CT Renal Stone Study ? ?Result Date: 06/27/2021 ?CLINICAL DATA:  Left flank pain, kidney stone suspected. EXAM: CT ABDOMEN AND PELVIS WITHOUT CONTRAST TECHNIQUE: Multidetector CT imaging of the abdomen and pelvis was performed following the standard protocol without IV contrast. RADIATION DOSE REDUCTION: This exam was performed according to the departmental dose-optimization program which includes automated exposure control, adjustment of the mA and/or kV according to patient size and/or use of iterative reconstruction technique. COMPARISON:  07/11/2016. FINDINGS: Lower chest: A 3 mm nodule is present in the left lower lobe, axial image 11. Hepatobiliary: No focal liver abnormality is seen. No gallstones, gallbladder wall thickening, or biliary dilatation. Pancreas: Unremarkable. No pancreatic ductal dilatation or surrounding inflammatory changes. Spleen: Normal in size without focal abnormality. Adrenals/Urinary Tract: The adrenal glands are within normal limits. There is a nonobstructive calculus in the left upper pole. The renal pyramids are hyperdense bilaterally. There is mild obstructive  uropathy on the left with a 2 mm calculus in the distal left ureter at the UVJ. No ureteral calculus on the right. The bladder is unremarkable. Stomach/Bowel: A small hiatal hernia is noted. Appendix appears normal. No evidence of bowel wall thickening, distention, or inflammatory changes. There is fatty infiltration of the walls of the rectosigmoid colon and distal ileum, compatible with chronic inflammatory changes. Vascular/Lymphatic: No significant vascular findings are present. No enlarged abdominal or pelvic lymph nodes. Reproductive: Prostate is unremarkable. Other: Small fat containing umbilical hernia.  No ascites. Musculoskeletal: No acute osseous abnormality. IMPRESSION: 1. Mild hydroureteronephrosis on the left with a 2 mm calculus in the distal left ureter at the UVJ. 2. Nonobstructive left renal calculus. 3. 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 4. Chronic inflammatory changes involving the rectosigmoid colon  and distal ileum. 5. Small hiatal hernia. Electronically Signed   By: Thornell SartoriusLaura  Taylor M.D.   On: 06/27/2021 21:26   ? ?Procedures ?Procedures  ? ? ?Medications Ordered in ED ?Medications  ?ondansetron (ZOFRAN) injection 4 mg (4 mg Intravenous Given 06/27/21 2349)  ?HYDROmorphone (DILAUDID) injection 0.5 mg (0.5 mg Intravenous Given 06/27/21 2349)  ?ketorolac (TORADOL) 15 MG/ML injection 15 mg (15 mg Intravenous Given 06/27/21 2349)  ?sodium chloride 0.9 % bolus 1,000 mL (0 mLs Intravenous Stopped 06/28/21 0032)  ? ? ?ED Course/ Medical Decision Making/ A&P ?  ?                        ?Medical Decision Making ?Amount and/or Complexity of Data Reviewed ?Labs: ordered. ?Radiology: ordered. ? ?Risk ?Prescription drug management. ? ? ?Patient with flank pain.  Left side.  History of kidney stones.  Feels the same.  Does have some radiation of chest.  Chest x-ray reassuring.  Doubt cardiac cause.  However CT scan independently interpreted and  does show kidney stone.  Likely cause.  No infection.  Feeling better after treatment.  Appears stable for discharge home.  Will follow-up with urology. ? ? ? ? ? ? ? ?Final Clinical Impression(s) / ED Diagnoses

## 2021-06-27 NOTE — ED Triage Notes (Signed)
Pt states he began having left flank pain similar to kidney stone around 5pm. Pt reports the pain has moved to his torso and down his left arm, which are tingling. Pt reports nausea and dry heaving for the past few hours.  ?

## 2021-06-27 NOTE — ED Notes (Signed)
Pt reports that he attempted to urinate but it was too painful, even though he felt the urgency to go. ?

## 2021-06-27 NOTE — ED Provider Triage Note (Signed)
Emergency Medicine Provider Triage Evaluation Note ? ?Carl Brown , a 36 y.o. male  was evaluated in triage.  Pt complains of left-sided flank pain.  Feels like he needs to urinate however cannot.  Felt some spasms to his left lower chest, abdomen earlier today which resolved.  No hematuria.  History of kidney stones, unsure if this feels similar. ? ?Review of Systems  ?Positive: Flank pain, difficulty urinating left lower chest pain ?Negative: SOB ? ?Physical Exam  ?BP 99/70 (BP Location: Left Arm)   Pulse 91   Temp (!) 97.3 ?F (36.3 ?C) (Oral)   Resp 20   Ht 5\' 5"  (1.651 m)   Wt 77.1 kg   SpO2 98%   BMI 28.29 kg/m?  ?Gen:   Awake, no distress   ?Resp:  Normal effort  ?MSK:   Moves extremities without difficulty  ?Other:   ? ?Medical Decision Making  ?Medically screening exam initiated at 8:58 PM.  Appropriate orders placed.  Mosi Hannold was informed that the remainder of the evaluation will be completed by another provider, this initial triage assessment does not replace that evaluation, and the importance of remaining in the ED until their evaluation is complete. ? ?Flank pain ?  ?Amarri Satterly A, PA-C ?06/27/21 2100 ? ?

## 2021-06-28 LAB — TROPONIN I (HIGH SENSITIVITY): Troponin I (High Sensitivity): 3 ng/L (ref <18)

## 2021-06-28 MED ORDER — OXYCODONE-ACETAMINOPHEN 5-325 MG PO TABS: 1.0000 | ORAL_TABLET | Freq: Three times a day (TID) | ORAL | 0 refills | Status: DC | PRN

## 2021-06-28 MED ORDER — ONDANSETRON 4 MG PO TBDP: 4.0000 mg | ORAL_TABLET | Freq: Three times a day (TID) | ORAL | 0 refills | Status: DC | PRN

## 2021-12-04 ENCOUNTER — Emergency Department (HOSPITAL_COMMUNITY)
Admission: EM | Admit: 2021-12-04 | Discharge: 2021-12-04 | Disposition: A | Discharge status: Home / Self Care | Payer: No Typology Code available for payment source | Attending: Emergency Medicine | Admitting: Emergency Medicine

## 2021-12-04 ENCOUNTER — Emergency Department (HOSPITAL_COMMUNITY): Payer: No Typology Code available for payment source

## 2021-12-04 ENCOUNTER — Other Ambulatory Visit: Payer: Self-pay

## 2021-12-04 ENCOUNTER — Encounter (HOSPITAL_COMMUNITY): Payer: Self-pay | Admitting: Emergency Medicine

## 2021-12-04 DIAGNOSIS — N200 Calculus of kidney: Secondary | ICD-10-CM

## 2021-12-04 DIAGNOSIS — U071 COVID-19: Secondary | ICD-10-CM

## 2021-12-04 DIAGNOSIS — R109 Unspecified abdominal pain: Secondary | ICD-10-CM | POA: Insufficient documentation

## 2021-12-04 DIAGNOSIS — E876 Hypokalemia: Secondary | ICD-10-CM

## 2021-12-04 DIAGNOSIS — R112 Nausea with vomiting, unspecified: Secondary | ICD-10-CM

## 2021-12-04 LAB — URINALYSIS, ROUTINE W REFLEX MICROSCOPIC
Bacteria, UA: NONE SEEN
Bilirubin Urine: NEGATIVE
Glucose, UA: NEGATIVE mg/dL
Hgb urine dipstick: NEGATIVE
Ketones, ur: 20 mg/dL — AB
Leukocytes,Ua: NEGATIVE
Nitrite: NEGATIVE
Protein, ur: 30 mg/dL — AB
Specific Gravity, Urine: >1.046 — ABNORMAL HIGH (ref 1.005–1.030)
pH: 5 (ref 5.0–8.0)

## 2021-12-04 LAB — COMPREHENSIVE METABOLIC PANEL
ALT: 29 U/L (ref 0–44)
AST: 34 U/L (ref 15–41)
Albumin: 4.6 g/dL (ref 3.5–5.0)
Alkaline Phosphatase: 55 U/L (ref 38–126)
Anion gap: 15 (ref 5–15)
BUN: 29 mg/dL — ABNORMAL HIGH (ref 6–20)
CO2: 19 mmol/L — ABNORMAL LOW (ref 22–32)
Calcium: 9.1 mg/dL (ref 8.9–10.3)
Chloride: 102 mmol/L (ref 98–111)
Creatinine, Ser: 1.26 mg/dL — ABNORMAL HIGH (ref 0.61–1.24)
GFR, Estimated: >60 mL/min (ref >60)
Glucose, Bld: 113 mg/dL — ABNORMAL HIGH (ref 70–99)
Potassium: 2.9 mmol/L — ABNORMAL LOW (ref 3.5–5.1)
Sodium: 136 mmol/L (ref 135–145)
Total Bilirubin: 1 mg/dL (ref 0.3–1.2)
Total Protein: 8.3 g/dL — ABNORMAL HIGH (ref 6.5–8.1)

## 2021-12-04 LAB — CBC
HCT: 51.5 % (ref 39.0–52.0)
Hemoglobin: 18.5 g/dL — ABNORMAL HIGH (ref 13.0–17.0)
MCH: 29.2 pg (ref 26.0–34.0)
MCHC: 35.9 g/dL (ref 30.0–36.0)
MCV: 81.4 fL (ref 80.0–100.0)
Platelets: 233 10*3/uL (ref 150–400)
RBC: 6.33 MIL/uL — ABNORMAL HIGH (ref 4.22–5.81)
RDW: 13.7 % (ref 11.5–15.5)
WBC: 5.9 10*3/uL (ref 4.0–10.5)
nRBC: 0 % (ref 0.0–0.2)

## 2021-12-04 LAB — RESP PANEL BY RT-PCR (FLU A&B, COVID) ARPGX2
Influenza A by PCR: NEGATIVE
Influenza B by PCR: NEGATIVE
SARS Coronavirus 2 by RT PCR: POSITIVE — AB

## 2021-12-04 LAB — MAGNESIUM: Magnesium: 2.1 mg/dL (ref 1.7–2.4)

## 2021-12-04 LAB — LIPASE, BLOOD: Lipase: 27 U/L (ref 11–51)

## 2021-12-04 MED ORDER — IOHEXOL 300 MG/ML  SOLN
100.0000 mL | Freq: Once | INTRAMUSCULAR | Status: AC | PRN
  Administered 2021-12-04: 100 mL via INTRAVENOUS

## 2021-12-04 MED ORDER — ONDANSETRON 8 MG PO TBDP
8.0000 mg | ORAL_TABLET | Freq: Once | ORAL | Status: AC
  Administered 2021-12-04: 8 mg via ORAL
  Filled 2021-12-04: qty 1

## 2021-12-04 MED ORDER — IBUPROFEN 800 MG PO TABS: 800.0000 mg | ORAL_TABLET | Freq: Four times a day (QID) | ORAL | 0 refills | Status: AC | PRN

## 2021-12-04 MED ORDER — LACTATED RINGERS IV BOLUS
1000.0000 mL | Freq: Once | INTRAVENOUS | Status: AC
Start: 2021-12-04 — End: 2021-12-04
  Administered 2021-12-04: 1000 mL via INTRAVENOUS

## 2021-12-04 MED ORDER — SODIUM CHLORIDE 0.9 % IV BOLUS: 1000.0000 mL | Freq: Once | INTRAVENOUS | Status: DC

## 2021-12-04 MED ORDER — ONDANSETRON 4 MG PO TBDP
4.0000 mg | ORAL_TABLET | Freq: Three times a day (TID) | ORAL | 0 refills | Status: DC | PRN
Start: 2021-12-04 — End: 2022-03-02

## 2021-12-04 MED ORDER — POTASSIUM CHLORIDE CRYS ER 20 MEQ PO TBCR
60.0000 meq | EXTENDED_RELEASE_TABLET | Freq: Once | ORAL | Status: AC
  Administered 2021-12-04: 60 meq via ORAL
  Filled 2021-12-04: qty 3

## 2021-12-04 MED ORDER — KETOROLAC TROMETHAMINE 15 MG/ML IJ SOLN
15.0000 mg | Freq: Once | INTRAMUSCULAR | Status: AC
  Administered 2021-12-04: 15 mg via INTRAVENOUS
  Filled 2021-12-04: qty 1

## 2021-12-04 NOTE — Discharge Instructions (Addendum)
Your work-up today was revealing of a low potassium and dehydration on your urine sample.  Your kidney function appeared to be slightly decreased which we often see in the setting of dehydration.  You have been treated with IV fluids, Toradol for body aches and Zofran for nausea.  I have prescribed the Zofran which will be available at your pharmacy.  As discussed I also prescribed 800 mg ibuprofen that you may take for any further body aches or discomfort.  Your work note is attached.  Additionally I have placed a urology office on these discharge papers for you to follow-up with with any further concerns about your kidney stones.  Please read the information about a bland diet and return with any worsening symptoms.  It was a pleasure to meet you and we hope you feel better!

## 2021-12-04 NOTE — ED Provider Notes (Signed)
Physical Exam  BP 110/69   Pulse 72   Temp 98.6 F (37 C) (Oral)   Resp 20   SpO2 98%   Physical Exam Vitals and nursing note reviewed.  Constitutional:      General: He is not in acute distress.    Appearance: He is well-developed.  HENT:     Head: Normocephalic and atraumatic.  Eyes:     Conjunctiva/sclera: Conjunctivae normal.  Cardiovascular:     Rate and Rhythm: Normal rate and regular rhythm.     Heart sounds: No murmur heard. Pulmonary:     Effort: Pulmonary effort is normal. No respiratory distress.     Breath sounds: Normal breath sounds. No wheezing.  Abdominal:     Palpations: Abdomen is soft.     Tenderness: There is no abdominal tenderness.     Comments: Left-sided flank/left lower quadrant abdominal tenderness  Musculoskeletal:        General: No swelling.     Cervical back: Neck supple.     Right lower leg: No edema.     Left lower leg: No edema.  Skin:    General: Skin is warm and dry.     Capillary Refill: Capillary refill takes less than 2 seconds.  Neurological:     Mental Status: He is alert.  Psychiatric:        Mood and Affect: Mood normal.     Procedures  Procedures  ED Course / MDM    Medical Decision Making Amount and/or Complexity of Data Reviewed Labs: ordered. Radiology: ordered.  Risk Prescription drug management.   Patient care handed off from [facility], [place].  Plan is to assess pending labs of magnesium.  Patient is receiving IV fluids as well as potassium supplementation.  Respiratory viral panel pending.  Plan is to DC with potassium supplementation over the next few days with close follow-up with PCP outpatient.  In short, patient had multiple symptoms began on Thursday including flank pain, nausea, vomiting, body aches, vomiting, diarrhea.  Denies any fever, chills, chest pain, shortness of breath, urinary symptoms.  CT imaging of the abdomen and pelvis significant for no acute abnormalities.  Stable stone in the  left upper pole that has been present from prior imaging studies.  Laboratory significant for hypokalemia with potassium 2.9 which is supplemented orally while notes department.  Patient has decrease in bicarb of 19 which supplemented through IV fluids.  BUN of 29, creatinine 1.26 with GFR of greater than 60 which is near patient's baseline from previous laboratory studies drawn.  No leukocytosis.  No evidence anemia.  Platelets within normal range.  UA significant for 20 ketones, 30 proteins with 6-10 RBC and 0-5 WBC.  Magnesium within normal range.  Lipase within normal range.  Respiratory viral panel pending at this time.  Patient symptoms likely secondary to viral illness given myriad of presenting symptoms.  Patient elected to not stay for respiratory viral panel.  To be discharged with Zofran to take as needed for nausea/emesis.  Patient was educated regarding proper fluid intake as well as attempting bland diet until symptoms resolve.  He is also recommended symptomatic therapy with Tylenol/Profen as needed for pain/fever.  Recommend close follow-up with PCP in 3 to 5 days for reevaluation.  Treatment plan discussed at length with patient he acknowledged understanding was agreeable to said plan.  Patient was stable upon discharge from the hospital.       Wilnette Kales, PA 12/04/21 1900    Countryman,  Cheri Rous, MD 12/07/21 [phone]

## 2021-12-04 NOTE — ED Provider Notes (Signed)
[facility]-EMERGENCY DEPT Provider Note   CSN: [id number] Arrival date & time: 12/04/21  1246     History  Chief Complaint  Patient presents with   Flank Pain   Emesis   Nausea   Generalized Body Aches    Carl Brown is a 36 y.o. male with a past medical history of nephrolithiasis presenting today due to flank pain, nausea vomiting and body aches.  All of the symptoms have been present since Thursday.  He reports that on Thursday he started to have lower back pain, worse on the left side.  He then went to work and continued to have discomfort and started to become nauseated and vomit.  Has been nauseous, vomiting and having diarrhea ever since.  Reports he has not had any back pain since Thursday or Friday.  Was unable to tolerate fluids.  Denies any recent travel, antibiotics, changes in his diet or known sick contacts     Flank Pain  Emesis      Home Medications Prior to Admission medications   Medication Sig Start Date End Date Taking? Authorizing Provider  amoxicillin (AMOXIL) 500 MG capsule Take 1 capsule (500 mg total) by mouth 3 (three) times daily. Patient not taking: Reported on 07/31/2016 12/27/13   Renne Crigler, PA-C  ibuprofen (ADVIL,MOTRIN) 200 MG tablet Take 400-600 mg by mouth every 6 (six) hours as needed for moderate pain.    [provider]  naproxen (NAPROSYN) 500 MG tablet Take 1 tablet (500 mg total) by mouth 2 (two) times daily. Patient not taking: Reported on 07/31/2016 12/27/13   Renne Crigler, PA-C  omeprazole (PRILOSEC OTC) 20 MG tablet Take 20 mg by mouth daily.    [provider]  ondansetron (ZOFRAN-ODT) 4 MG disintegrating tablet Take 1 tablet (4 mg total) by mouth every 8 (eight) hours as needed for nausea or vomiting. 06/28/21   Benjiman Core, MD  oxyCODONE-acetaminophen (PERCOCET/ROXICET) 5-325 MG tablet Take 1-2 tablets by mouth every 8 (eight) hours as needed for severe pain. 06/28/21   Benjiman Core, MD      Allergies    Patient has no known allergies.    Review of Systems   Review of Systems  Gastrointestinal:  Positive for vomiting.  Genitourinary:  Positive for flank pain.    Physical Exam Updated Vital Signs BP 113/80 (BP Location: Left Arm)   Pulse 98   Temp 98.7 F (37.1 C) (Oral)   Resp 20   SpO2 100%  Physical Exam Vitals and nursing note reviewed.  Constitutional:      General: He is not in acute distress.    Appearance: Normal appearance. He is not ill-appearing.  HENT:     Head: Normocephalic and atraumatic.     Mouth/Throat:     Mouth: Mucous membranes are dry.     Pharynx: Oropharynx is clear.  Eyes:     General: No scleral icterus.    Conjunctiva/sclera: Conjunctivae normal.  Cardiovascular:     Rate and Rhythm: Normal rate and regular rhythm.  Pulmonary:     Effort: Pulmonary effort is normal. No respiratory distress.  Abdominal:     General: Abdomen is flat.     Palpations: Abdomen is soft.     Tenderness: There is no right CVA tenderness or left CVA tenderness.  Skin:    General: Skin is warm and dry.     Findings: No rash.  Neurological:     Mental Status: He is alert.  Psychiatric:  Mood and Affect: Mood normal.     ED Results / Procedures / Treatments   Labs (all labs ordered are listed, but only abnormal results are displayed) Labs Reviewed  COMPREHENSIVE METABOLIC PANEL - Abnormal; Notable for the following components:      Result Value   Potassium 2.9 (*)    CO2 19 (*)    Glucose, Bld 113 (*)    BUN 29 (*)    Creatinine, Ser 1.26 (*)    Total Protein 8.3 (*)    All other components within normal limits  CBC - Abnormal; Notable for the following components:   RBC 6.33 (*)    Hemoglobin 18.5 (*)    All other components within normal limits  URINALYSIS, ROUTINE W REFLEX MICROSCOPIC - Abnormal; Notable for the following components:   Color, Urine AMBER (*)    Specific Gravity, Urine >1.046 (*)    Ketones, ur  20 (*)    Protein, ur 30 (*)    All other components within normal limits  RESP PANEL BY RT-PCR (FLU A&B, COVID) ARPGX2  LIPASE, BLOOD    EKG None  Radiology CT Abdomen Pelvis W Contrast  Result Date: 12/04/2021 CLINICAL DATA:  Left lower quadrant abdominal pain. EXAM: CT ABDOMEN AND PELVIS WITH CONTRAST TECHNIQUE: Multidetector CT imaging of the abdomen and pelvis was performed using the standard protocol following bolus administration of intravenous contrast. RADIATION DOSE REDUCTION: This exam was performed according to the departmental dose-optimization program which includes automated exposure control, adjustment of the mA and/or kV according to patient size and/or use of iterative reconstruction technique. CONTRAST:  OMNIPAQUE IOHEXOL 300 MG/ML  SOLN COMPARISON:  06/27/2021. FINDINGS: Lower chest: Clear lung bases. Hepatobiliary: No focal liver abnormality is seen. No gallstones, gallbladder wall thickening, or biliary dilatation. Pancreas: Unremarkable. No pancreatic ductal dilatation or surrounding inflammatory changes. Spleen: Normal in size without focal abnormality. Adrenals/Urinary Tract: Normal adrenal glands. Kidneys normal size, orientation and position with symmetric enhancement. Small nonobstructing stone in the upper pole the left kidney. No other intrarenal stones, no renal masses and no hydronephrosis. Normal ureters. Normal bladder. Stomach/Bowel: Stomach is within normal limits. Appendix appears normal. No evidence of bowel wall thickening, distention, or inflammatory changes. Vascular/Lymphatic: No significant vascular findings are present. No enlarged abdominal or pelvic lymph nodes. Reproductive: Unremarkable. Other: No abdominal wall hernia or abnormality. No abdominopelvic ascites. Musculoskeletal: No acute or significant osseous findings. IMPRESSION: 1. No acute findings within the abdomen or pelvis. 2. Small nonobstructing stone in the upper pole the left kidney,  stable from the prior CT. No ureteral stone or obstructive uropathy. No other abnormalities. Electronically Signed   By: Amie Portland M.D.   On: 12/04/2021 16:34    Procedures Procedures   Medications Ordered in ED Medications  lactated ringers bolus 1,000 mL (has no administration in time range)  ondansetron (ZOFRAN-ODT) disintegrating tablet 8 mg (8 mg Oral Given 12/04/21 1355)  iohexol (OMNIPAQUE) 300 MG/ML solution 100 mL (100 mLs Intravenous Contrast Given 12/04/21 1558)  ketorolac (TORADOL) 15 MG/ML injection 15 mg (15 mg Intravenous Given 12/04/21 1730)  potassium chloride SA (KLOR-CON M) CR tablet 60 mEq (60 mEq Oral Given 12/04/21 1730)    ED Course/ Medical Decision Making/ A&P                           Medical Decision Making Amount and/or Complexity of Data Reviewed Labs: ordered.  Risk Prescription drug management.   This  is a 36 year old male who presents to the ED for concern of bilateral flank pain, nausea, vomiting and diarrhea.  Differential includes but is not limited to nephrolithiasis, muscle strain, COVID/flu or other viral illness, cholecystitis, pancreatitis and gastroenteritis.   This is not an exhaustive differential.    Past Medical History / Co-morbidities / Social History: Nephrolithiasis present in April 2023   Additional history: I reviewed patient's presentation in April 2023 where he was found to have a small left-sided nephrolithiasis.  He was referred to urology but reports he never saw them.   Physical Exam: Pertinent physical exam findings include Dry mucous membranes Pale  Lab Tests: I ordered, and personally interpreted labs.  The pertinent results include: Hypokalemia 2.9 Creatinine 1.26, BUN 29 Urinalysis with ketonuria and proteinuria and elevated specific gravity consistent with dehydration   Imaging Studies: I ordered and independently visualized and interpreted CT abdomen pelvis and I agree with the radiologist that there are no  acute findings.  There is a small kidney stone the patient's left kidney however it is nonobstructing and likely not the cause of his symptoms.  It is stable from previous scan.    Medications: Zofran ordered in triage and resolved patient's nausea prior to my evaluation.  He did have mild left-sided abdominal pain that I treated with Toradol.    MDM/Disposition: This is a 36 year old male with no known past medical history presenting today due to left flank pain, nausea and vomiting and diarrhea.  Have been going on since Thursday.  Patient had no CVA tenderness on physical exam and did not appear to have a kidney stone on physical exam.  CT imaging was with contrast so this understandably is not the best scan for evaluation of nephrolithiasis however the only thing noted was a small stone in the pole of the left kidney.  This is very unlikely to be causing his symptoms.  I do not believe it is necessary to look further with ultrasound or CT Noncon imaging.  I believe his pain to be more consistent with body aches in the setting of a viral illness.  He is volume down with dry mucous membranes on physical exam.  Urinalysis also reflects dehydration.  Hypokalemic secondary to emesis and diarrhea.  I have ordered Toradol, fluids and oral potassium since patient is no longer nauseated.  He reports that he is already feeling better prior to these medications but would like to stay and have the work-up completed. Mag and covid pending. At this time patient will be signed out to [place], [place].  Please see his chart for further work-up and disposition.  Ultimately I suspect patient will be discharged home after completion of his fluids and medications.  I have already sent Zofran to his pharmacy and attached a work note.  Patient is also agreeable to this plan.    Final Clinical Impression(s) / ED Diagnoses Final diagnoses:  Hypokalemia  Nausea and vomiting, unspecified vomiting type    Rx / DC Orders ED  Discharge Orders          Ordered    ondansetron (ZOFRAN-ODT) 4 MG disintegrating tablet  Every 8 hours PRN        12/04/21 1715    ibuprofen (ADVIL) 800 MG tablet  Every 6 hours PRN        12/04/21 1716               Ly Bacchi, Cecilio Asper, PA-C 12/04/21 1745    Ezequiel Essex, MD 12/04/21  2323  

## 2021-12-04 NOTE — ED Triage Notes (Signed)
Pt reports left sided flank pain, body aches, N/V/D. Pt states he was weak as well.

## 2021-12-04 NOTE — ED Provider Triage Note (Signed)
Emergency Medicine Provider Triage Evaluation Note  Carl Brown , a 36 y.o. male  was evaluated in triage.  Pt complains of left-sided flank pain, generalized weakness, cough, nasal congestion, sore throat, nausea/vomiting/diarrhea since Thursday.  Patient states that he has been unable to keep anything down since symptom onset.  Reports history of kidney stones but states this abdominal pain feels different than usual.  Reports 1 episode of bright red blood per rectum this morning but denies other melena or hematemesis.  Denies known fever/chills, chest pain or shortness of breath..  Review of Systems  Positive: See above Negative:   Physical Exam  BP 113/80 (BP Location: Left Arm)   Pulse 98   Temp 98.7 F (37.1 C) (Oral)   Resp 20   SpO2 100%  Gen:   Awake, no distress   Resp:  Normal effort  MSK:   Moves extremities without difficulty  Other:  Lungs clear to auscultation.  No obvious murmurs gallops or rubs.  Left lower quadrant abdominal tenderness.  Medical Decision Making  Medically screening exam initiated at 1:45 PM.  Appropriate orders placed.  Darby Shadwick was informed that the remainder of the evaluation will be completed by another provider, this initial triage assessment does not replace that evaluation, and the importance of remaining in the ED until their evaluation is complete.     Wilnette Kales, [place] 12/04/21 1347

## 2022-03-02 ENCOUNTER — Emergency Department (HOSPITAL_BASED_OUTPATIENT_CLINIC_OR_DEPARTMENT_OTHER): Payer: No Typology Code available for payment source

## 2022-03-02 ENCOUNTER — Emergency Department (HOSPITAL_BASED_OUTPATIENT_CLINIC_OR_DEPARTMENT_OTHER)
Admission: EM | Admit: 2022-03-02 | Discharge: 2022-03-02 | Disposition: A | Discharge status: Home / Self Care | Payer: No Typology Code available for payment source | Attending: Emergency Medicine | Admitting: Emergency Medicine

## 2022-03-02 ENCOUNTER — Other Ambulatory Visit: Payer: Self-pay

## 2022-03-02 ENCOUNTER — Encounter (HOSPITAL_BASED_OUTPATIENT_CLINIC_OR_DEPARTMENT_OTHER): Payer: Self-pay | Admitting: Emergency Medicine

## 2022-03-02 DIAGNOSIS — N200 Calculus of kidney: Secondary | ICD-10-CM | POA: Insufficient documentation

## 2022-03-02 LAB — CBC
HCT: 46 % (ref 39.0–52.0)
Hemoglobin: 15.5 g/dL (ref 13.0–17.0)
MCH: 29.8 pg (ref 26.0–34.0)
MCHC: 33.7 g/dL (ref 30.0–36.0)
MCV: 88.5 fL (ref 80.0–100.0)
Platelets: 50 10*3/uL — ABNORMAL LOW (ref 150–400)
RBC: 5.2 MIL/uL (ref 4.22–5.81)
RDW: 13.2 % (ref 11.5–15.5)
WBC: 14 10*3/uL — ABNORMAL HIGH (ref 4.0–10.5)
nRBC: 0 % (ref 0.0–0.2)

## 2022-03-02 LAB — COMPREHENSIVE METABOLIC PANEL
ALT: 16 U/L (ref 0–44)
AST: 33 U/L (ref 15–41)
Albumin: 4.7 g/dL (ref 3.5–5.0)
Alkaline Phosphatase: 51 U/L (ref 38–126)
Anion gap: 19 — ABNORMAL HIGH (ref 5–15)
BUN: 15 mg/dL (ref 6–20)
CO2: 18 mmol/L — ABNORMAL LOW (ref 22–32)
Calcium: 9.7 mg/dL (ref 8.9–10.3)
Chloride: 101 mmol/L (ref 98–111)
Creatinine, Ser: 1.04 mg/dL (ref 0.61–1.24)
GFR, Estimated: >60 mL/min (ref >60)
Glucose, Bld: 107 mg/dL — ABNORMAL HIGH (ref 70–99)
Potassium: 4 mmol/L (ref 3.5–5.1)
Sodium: 138 mmol/L (ref 135–145)
Total Bilirubin: 0.6 mg/dL (ref 0.3–1.2)
Total Protein: 7.7 g/dL (ref 6.5–8.1)

## 2022-03-02 LAB — URINALYSIS, ROUTINE W REFLEX MICROSCOPIC
Bilirubin Urine: NEGATIVE
Glucose, UA: NEGATIVE mg/dL
Hgb urine dipstick: NEGATIVE
Ketones, ur: NEGATIVE mg/dL
Leukocytes,Ua: NEGATIVE
Nitrite: NEGATIVE
Specific Gravity, Urine: 1.024 (ref 1.005–1.030)
pH: 7.5 (ref 5.0–8.0)

## 2022-03-02 LAB — LIPASE, BLOOD: Lipase: <10 U/L — ABNORMAL LOW (ref 11–51)

## 2022-03-02 MED ORDER — ONDANSETRON HCL 4 MG/2ML IJ SOLN
4.0000 mg | Freq: Once | INTRAMUSCULAR | Status: AC | PRN
  Administered 2022-03-02: 4 mg via INTRAVENOUS
  Filled 2022-03-02: qty 2

## 2022-03-02 MED ORDER — TAMSULOSIN HCL 0.4 MG PO CAPS: 0.4000 mg | ORAL_CAPSULE | Freq: Every day | ORAL | 0 refills | Status: AC

## 2022-03-02 MED ORDER — ONDANSETRON 4 MG PO TBDP: ORAL_TABLET | ORAL | 0 refills | Status: AC

## 2022-03-02 MED ORDER — KETOROLAC TROMETHAMINE 15 MG/ML IJ SOLN
15.0000 mg | Freq: Once | INTRAMUSCULAR | Status: AC
  Administered 2022-03-02: 15 mg via INTRAVENOUS
  Filled 2022-03-02: qty 1

## 2022-03-02 MED ORDER — MORPHINE SULFATE 15 MG PO TABS: 7.5000 mg | ORAL_TABLET | ORAL | 0 refills | Status: DC | PRN

## 2022-03-02 MED ORDER — FENTANYL CITRATE PF 50 MCG/ML IJ SOSY
50.0000 ug | PREFILLED_SYRINGE | INTRAMUSCULAR | Status: DC | PRN
  Administered 2022-03-02: 50 ug via INTRAVENOUS
  Filled 2022-03-02: qty 1

## 2022-03-02 NOTE — ED Triage Notes (Signed)
Started today around 2pm Left flank pain "Feels like kidney stones when I've had them before"

## 2022-03-02 NOTE — Discharge Instructions (Addendum)
Take 4 over the counter ibuprofen tablets 3 times a day or 2 over-the-counter naproxen tablets twice a day for pain. Also take tylenol 1000mg (2 extra strength) four times a day.   Then take the pain medicine if you feel like you need it. Narcotics do not help with the pain, they only make you care about it less.  You can become addicted to this, people may break into your house to steal it.  It will constipate you.  If you drive under the influence of this medicine you can get a DUI.    Follow-up with the urologist in the office.  Please return for uncontrolled pain fever inability eat or drink.

## 2022-03-02 NOTE — ED Notes (Signed)
Patient transported to CT 

## 2022-03-02 NOTE — ED Provider Notes (Signed)
[facility] EMERGENCY DEPT Provider Note   CSN: [id number] Arrival date & time: 03/02/22  1618     History  Chief Complaint  Patient presents with   Flank Pain    Carl Brown is a 36 y.o. male.  36 yo M with a chief complaint of left side pain.  This been going on for since this morning.  Feels just like a kidney stone.  No urinary symptoms no fevers.  No trauma.   Flank Pain       Home Medications Prior to Admission medications   Medication Sig Start Date End Date Taking? Authorizing Provider  morphine (MSIR) 15 MG tablet Take 0.5 tablets (7.5 mg total) by mouth every 4 (four) hours as needed for severe pain. 03/02/22  Yes Melene Plan, DO  ondansetron (ZOFRAN-ODT) 4 MG disintegrating tablet 4mg  ODT q4 hours prn nausea/vomit 03/02/22  Yes 03/04/22, DO  tamsulosin (FLOMAX) 0.4 MG CAPS capsule Take 1 capsule (0.4 mg total) by mouth daily after supper. 03/02/22  Yes 03/04/22, DO  amoxicillin (AMOXIL) 500 MG capsule Take 1 capsule (500 mg total) by mouth 3 (three) times daily. Patient not taking: Reported on 07/31/2016 12/27/13   12/29/13, PA-C  ibuprofen (ADVIL) 800 MG tablet Take 1 tablet (800 mg total) by mouth every 6 (six) hours as needed for moderate pain. 12/04/21   Redwine, Madison A, PA-C  naproxen (NAPROSYN) 500 MG tablet Take 1 tablet (500 mg total) by mouth 2 (two) times daily. Patient not taking: Reported on 07/31/2016 12/27/13   12/29/13, PA-C  omeprazole (PRILOSEC OTC) 20 MG tablet Take 20 mg by mouth daily.    [provider]  oxyCODONE-acetaminophen (PERCOCET/ROXICET) 5-325 MG tablet Take 1-2 tablets by mouth every 8 (eight) hours as needed for severe pain. 06/28/21   06/30/21, MD      Allergies    Patient has no known allergies.    Review of Systems   Review of Systems  Genitourinary:  Positive for flank pain.    Physical Exam Updated Vital Signs BP 122/79   Pulse 63   Temp 98.1 F (36.7 C) (Oral)   Resp  20   SpO2 100%  Physical Exam Vitals and nursing note reviewed.  Constitutional:      Appearance: He is well-developed.  HENT:     Head: Normocephalic and atraumatic.  Eyes:     Pupils: Pupils are equal, round, and reactive to light.  Neck:     Vascular: No JVD.  Cardiovascular:     Rate and Rhythm: Normal rate and regular rhythm.     Heart sounds: No murmur heard.    No friction rub. No gallop.  Pulmonary:     Effort: No respiratory distress.     Breath sounds: No wheezing.  Abdominal:     General: There is no distension.     Tenderness: There is no abdominal tenderness. There is no guarding or rebound.  Musculoskeletal:        General: Normal range of motion.     Cervical back: Normal range of motion and neck supple.  Skin:    Coloration: Skin is not pale.     Findings: No rash.  Neurological:     Mental Status: He is alert and oriented to person, place, and time.  Psychiatric:        Behavior: Behavior normal.     ED Results / Procedures / Treatments   Labs (all labs ordered are listed, but only  abnormal results are displayed) Labs Reviewed  LIPASE, BLOOD - Abnormal; Notable for the following components:      Result Value   Lipase <10 (*)    All other components within normal limits  COMPREHENSIVE METABOLIC PANEL - Abnormal; Notable for the following components:   CO2 18 (*)    Glucose, Bld 107 (*)    Anion gap 19 (*)    All other components within normal limits  CBC - Abnormal; Notable for the following components:   WBC 14.0 (*)    Platelets 50 (*)    All other components within normal limits  URINALYSIS, ROUTINE W REFLEX MICROSCOPIC - Abnormal; Notable for the following components:   Protein, ur TRACE (*)    All other components within normal limits    EKG None  Radiology CT Renal Stone Study  Result Date: 03/02/2022 CLINICAL DATA:  Left abdominal/flank pain.  Renal stone suspected. EXAM: CT ABDOMEN AND PELVIS WITHOUT CONTRAST TECHNIQUE:  Multidetector CT imaging of the abdomen and pelvis was performed following the standard protocol without IV contrast. RADIATION DOSE REDUCTION: This exam was performed according to the departmental dose-optimization program which includes automated exposure control, adjustment of the mA and/or kV according to patient size and/or use of iterative reconstruction technique. COMPARISON:  Multiple priors including most recent CT abdomen pelvis December 04, 2021 FINDINGS: Lower chest: No acute abnormality.  Small hiatal hernia. Hepatobiliary: Unremarkable noncontrast enhanced appearance of the hepatic parenchyma. Gallbladder is unremarkable. No biliary ductal dilation. Pancreas: No pancreatic ductal dilation or evidence of acute inflammation. Spleen: No splenomegaly. Adrenals/Urinary Tract: Bilateral adrenal glands appear normal. Mild prominence of the left renal collecting system and ureter with a 2 mm stone in the distal ureter near the UVJ. No right-sided hydronephrosis. Urinary bladder is unremarkable for degree of distension. Stomach/Bowel: Stomach is nondistended limiting evaluation. No pathologic dilation of small or large bowel. The appendix and terminal ileum appear normal. No evidence of acute bowel inflammation. Vascular/Lymphatic: Normal caliber abdominal aorta. No pathologically enlarged abdominal or pelvic lymph nodes. Reproductive: Prostate is unremarkable. Other: No significant abdominopelvic free fluid. Musculoskeletal: No acute osseous abnormality. IMPRESSION: 1. Mild prominence of the left renal collecting system and ureter with a 2 mm stone in the distal ureter near the UVJ. 2. Small hiatal hernia. Electronically Signed   By: Maudry Mayhew M.D.   On: 03/02/2022 17:43    Procedures Procedures    Medications Ordered in ED Medications  fentaNYL (SUBLIMAZE) injection 50 mcg (50 mcg Intravenous Given 03/02/22 1644)  ondansetron (ZOFRAN) injection 4 mg (4 mg Intravenous Given 03/02/22 1645)   ketorolac (TORADOL) 15 MG/ML injection 15 mg (15 mg Intravenous Given 03/02/22 1729)    ED Course/ Medical Decision Making/ A&P                           Medical Decision Making Amount and/or Complexity of Data Reviewed Labs: ordered. Radiology: ordered.  Risk Prescription drug management.   36 yo M with a chief complaint of left flank pain.  Feels just like kidney stones has had in the past.  CT stone study is consistent with a 2 mm left-sided UVJ stone.  Patient tells me this is his fifth stone.  Is never needed urologic intervention and is never actually seen a urologist.  Will give urology follow-up.  UA negative for infection.  Pain significantly improved after dose of Toradol.  7:25 PM:  I have discussed the diagnosis/risks/treatment options with  the patient.  Evaluation and diagnostic testing in the emergency department does not suggest an emergent condition requiring admission or immediate intervention beyond what has been performed at this time.  They will follow up with PCP. We also discussed returning to the ED immediately if new or worsening sx occur. We discussed the sx which are most concerning (e.g., sudden worsening pain, fever, inability to tolerate by mouth) that necessitate immediate return. Medications administered to the patient during their visit and any new prescriptions provided to the patient are listed below.  Medications given during this visit Medications  fentaNYL (SUBLIMAZE) injection 50 mcg (50 mcg Intravenous Given 03/02/22 1644)  ondansetron (ZOFRAN) injection 4 mg (4 mg Intravenous Given 03/02/22 1645)  ketorolac (TORADOL) 15 MG/ML injection 15 mg (15 mg Intravenous Given 03/02/22 1729)     The patient appears reasonably screen and/or stabilized for discharge and I doubt any other medical condition or other [facility] requiring further screening, evaluation, or treatment in the ED at this time prior to discharge.          Final Clinical Impression(s)  / ED Diagnoses Final diagnoses:  Nephrolithiasis    Rx / DC Orders ED Discharge Orders          Ordered    morphine (MSIR) 15 MG tablet  Every 4 hours PRN        03/02/22 1909    ondansetron (ZOFRAN-ODT) 4 MG disintegrating tablet        03/02/22 1909    tamsulosin (FLOMAX) 0.4 MG CAPS capsule  Daily after supper        03/02/22 1909              Melene Plan, DO 03/02/22 1925

## 2022-03-02 NOTE — ED Notes (Signed)
Has ride home. Pt verbalized understanding of d/c instructions, meds, and followup care. Denies questions. VSS, no distress noted. Steady gait to exit with all belongings.

## 2022-03-07 ENCOUNTER — Telehealth: Payer: Self-pay | Admitting: Physician Assistant

## 2022-03-07 DIAGNOSIS — N200 Calculus of kidney: Secondary | ICD-10-CM

## 2022-03-07 NOTE — Progress Notes (Signed)
Virtual Visit Consent   [name], you are scheduled for a virtual visit with a [facility] provider today. Just as with appointments in the office, your consent must be obtained to participate. Your consent will be active for this visit and any virtual visit you may have with one of our providers in the next 365 days. If you have a MyChart account, a copy of this consent can be sent to you electronically.  As this is a virtual visit, video technology does not allow for your provider to perform a traditional examination. This may limit your provider's ability to fully assess your condition. If your provider identifies any concerns that need to be evaluated in person or the need to arrange testing (such as labs, EKG, etc.), we will make arrangements to do so. Although advances in technology are sophisticated, we cannot ensure that it will always work on either your end or our end. If the connection with a video visit is poor, the visit may have to be switched to a telephone visit. With either a video or telephone visit, we are not always able to ensure that we have a secure connection.  By engaging in this virtual visit, you consent to the provision of healthcare and authorize for your insurance to be billed (if applicable) for the services provided during this visit. Depending on your insurance coverage, you may receive a charge related to this service.  I need to obtain your verbal consent now. Are you willing to proceed with your visit today? Carl Brown has provided verbal consent on 03/07/2022 for a virtual visit (video or telephone). Leeanne Rio, [place]  Date: 03/07/2022 4:11 PM  Virtual Visit via Video Note   I, Leeanne Rio, connected with  Carl Brown  ([id number], Dec 24, 1985) on 03/07/22 at  4:00 PM EST by a video-enabled telemedicine application and verified that I am speaking with the correct person using two identifiers.  Location: Patient: Virtual Visit Location Patient:  Home Provider: Virtual Visit Location Provider: Home Office   I discussed the limitations of evaluation and management by telemedicine and the availability of in person appointments. The patient expressed understanding and agreed to proceed.    History of Present Illness: Carl Brown is a 37 y.o. who identifies as a male who was assigned male at birth, and is being seen today for ER follow-up for nephrolithiasis.  Patient states he tried to follow-up with his primary care provider, but was told that since he had not been seen since 2021 he would have to reestablish as a new patient and they did not have any availability currently.  Patient was evaluated at the [facility] ER on 02/10/2022 with complaints of left flank pain starting that morning.  Had history of kidney stone and thought it was the same.  ER evaluation included labs which were overall unremarkable except for noted thrombocytopenia which needs primary care follow-up, and CT scan revealing a 2 mm left ureteral stone in the distal ureter near the UVJ.  Patient was given fentanyl and Toradol in the emergency room.  Was sent home with a prescription for tamsulosin and morphine. Patient endorses that pain continued up until yesterday, requiring use of the pain medication which caused constipation.  Notes he did pass the stone yesterday.  On questioning, he reveals that he was not given a strainer to catch the stone for analysis.  Pain has resolved since passing of the stone.  Has stopped pain medication with some improvement in constipation.  Is unsure how long he should continue the Flomax.  Is also needing a note to return to work.  ER help to set up an appointment with a urologist, but his insurance is requiring a PCP referral for him to be able to see them.   HPI: HPI  Problems: There are no problems to display for this patient.   Allergies: No Known Allergies Medications:  Current Outpatient Medications:    ibuprofen (ADVIL) 800 MG tablet,  Take 1 tablet (800 mg total) by mouth every 6 (six) hours as needed for moderate pain., Disp: 30 tablet, Rfl: 0   omeprazole (PRILOSEC OTC) 20 MG tablet, Take 20 mg by mouth daily., Disp: , Rfl:    ondansetron (ZOFRAN-ODT) 4 MG disintegrating tablet, 4mg  ODT q4 hours prn nausea/vomit, Disp: 20 tablet, Rfl: 0   tamsulosin (FLOMAX) 0.4 MG CAPS capsule, Take 1 capsule (0.4 mg total) by mouth daily after supper., Disp: 30 capsule, Rfl: 0  Observations/Objective: Patient is well-developed, well-nourished in no acute distress.  Resting comfortably at home.  Head is normocephalic, atraumatic.  No labored breathing.  Speech is clear and coherent with logical content.  Patient is alert and oriented at baseline.   Assessment and Plan: 1. Nephrolithiasis  Per patient report, this seems to have passed.  Unfortunately he was not given a strainer so that the stone could be analyzed giving his history of kidney stone without prior specialist evaluation.  Will have him get a strainer from the pharmacy to have on hand just in case there are any small pieces left of the stone that need to pass.  Can continue Flomax for couple more days but as long as not passing any further debris and no recurrence of pain, this can be stopped.  No indication to continue pain medication.  Unfortunately cannot give him a referral.  He is to reach back out to his PCP as they were trying to get him in with another provider in the practice.  Work note provided.  Follow Up Instructions: I discussed the assessment and treatment plan with the patient. The patient was provided an opportunity to ask questions and all were answered. The patient agreed with the plan and demonstrated an understanding of the instructions.  A copy of instructions were sent to the patient via MyChart unless otherwise noted below.   The patient was advised to call back or seek an in-person evaluation if the symptoms worsen or if the condition fails to improve as  anticipated.  Time:  I spent 10 minutes with the patient via telehealth technology discussing the above problems/concerns.    Leeanne Rio, PA-C

## 2022-03-07 NOTE — Patient Instructions (Signed)
  Shirley Friar, thank you for joining Leeanne Rio, PA-C for today's virtual visit.  While this provider is not your primary care provider (PCP), if your PCP is located in our provider database this encounter information will be shared with them immediately following your visit.   [facility] account gives you access to today's visit and all your visits, tests, and labs performed at [facility] " click here if you don't have a [facility] account or go to [email or web address]  Consent: (Patient) Carl Brown provided verbal consent for this virtual visit at the beginning of the encounter.  Current Medications:  Current Outpatient Medications:    ibuprofen (ADVIL) 800 MG tablet, Take 1 tablet (800 mg total) by mouth every 6 (six) hours as needed for moderate pain., Disp: 30 tablet, Rfl: 0   omeprazole (PRILOSEC OTC) 20 MG tablet, Take 20 mg by mouth daily., Disp: , Rfl:    ondansetron (ZOFRAN-ODT) 4 MG disintegrating tablet, 4mg  ODT q4 hours prn nausea/vomit, Disp: 20 tablet, Rfl: 0   tamsulosin (FLOMAX) 0.4 MG CAPS capsule, Take 1 capsule (0.4 mg total) by mouth daily after supper., Disp: 30 capsule, Rfl: 0   Medications ordered in this encounter:  No orders of the defined types were placed in this encounter.    *If you need refills on other medications prior to your next appointment, please contact your pharmacy*  Follow-Up: Call back or seek an in-person evaluation if the symptoms worsen or if the condition fails to improve as anticipated.  [facility] [phone]  Other Instructions Please keep well-hydrated and try to rest. Get good fiber intake to help your stools return to normal after stopping the morphine. You can continue the Flomax for a couple more days, but if not passing any more debris, you can stop this. Continue discussion with your PCP about referral to urology. If you note any recurrence of pain or any new or  worsening symptoms, you need to seek a repeat in person evaluation.   If you have been instructed to have an in-person evaluation today at a local Urgent Care facility, please use the link below. It will take you to a list of all of our available [facility], including address, phone number and hours of operation. Please do not delay care.  [facility]  If you or a family member do not have a primary care provider, use the link below to schedule a visit and establish care. When you choose a [facility] primary care physician or advanced practice provider, you gain a long-term partner in health. Find a Primary Care Provider  Learn more about [facility]'s in-office and virtual care options: [facility]

## 2023-12-16 IMAGING — CR DG CHEST 2V
2 series · 2 of 2 positions shown · non-contrast
Comparison: 12/13/2016.

CLINICAL DATA: Chest pain, left flank pain.

EXAM:
CHEST - 2 VIEW

[w chest pa]
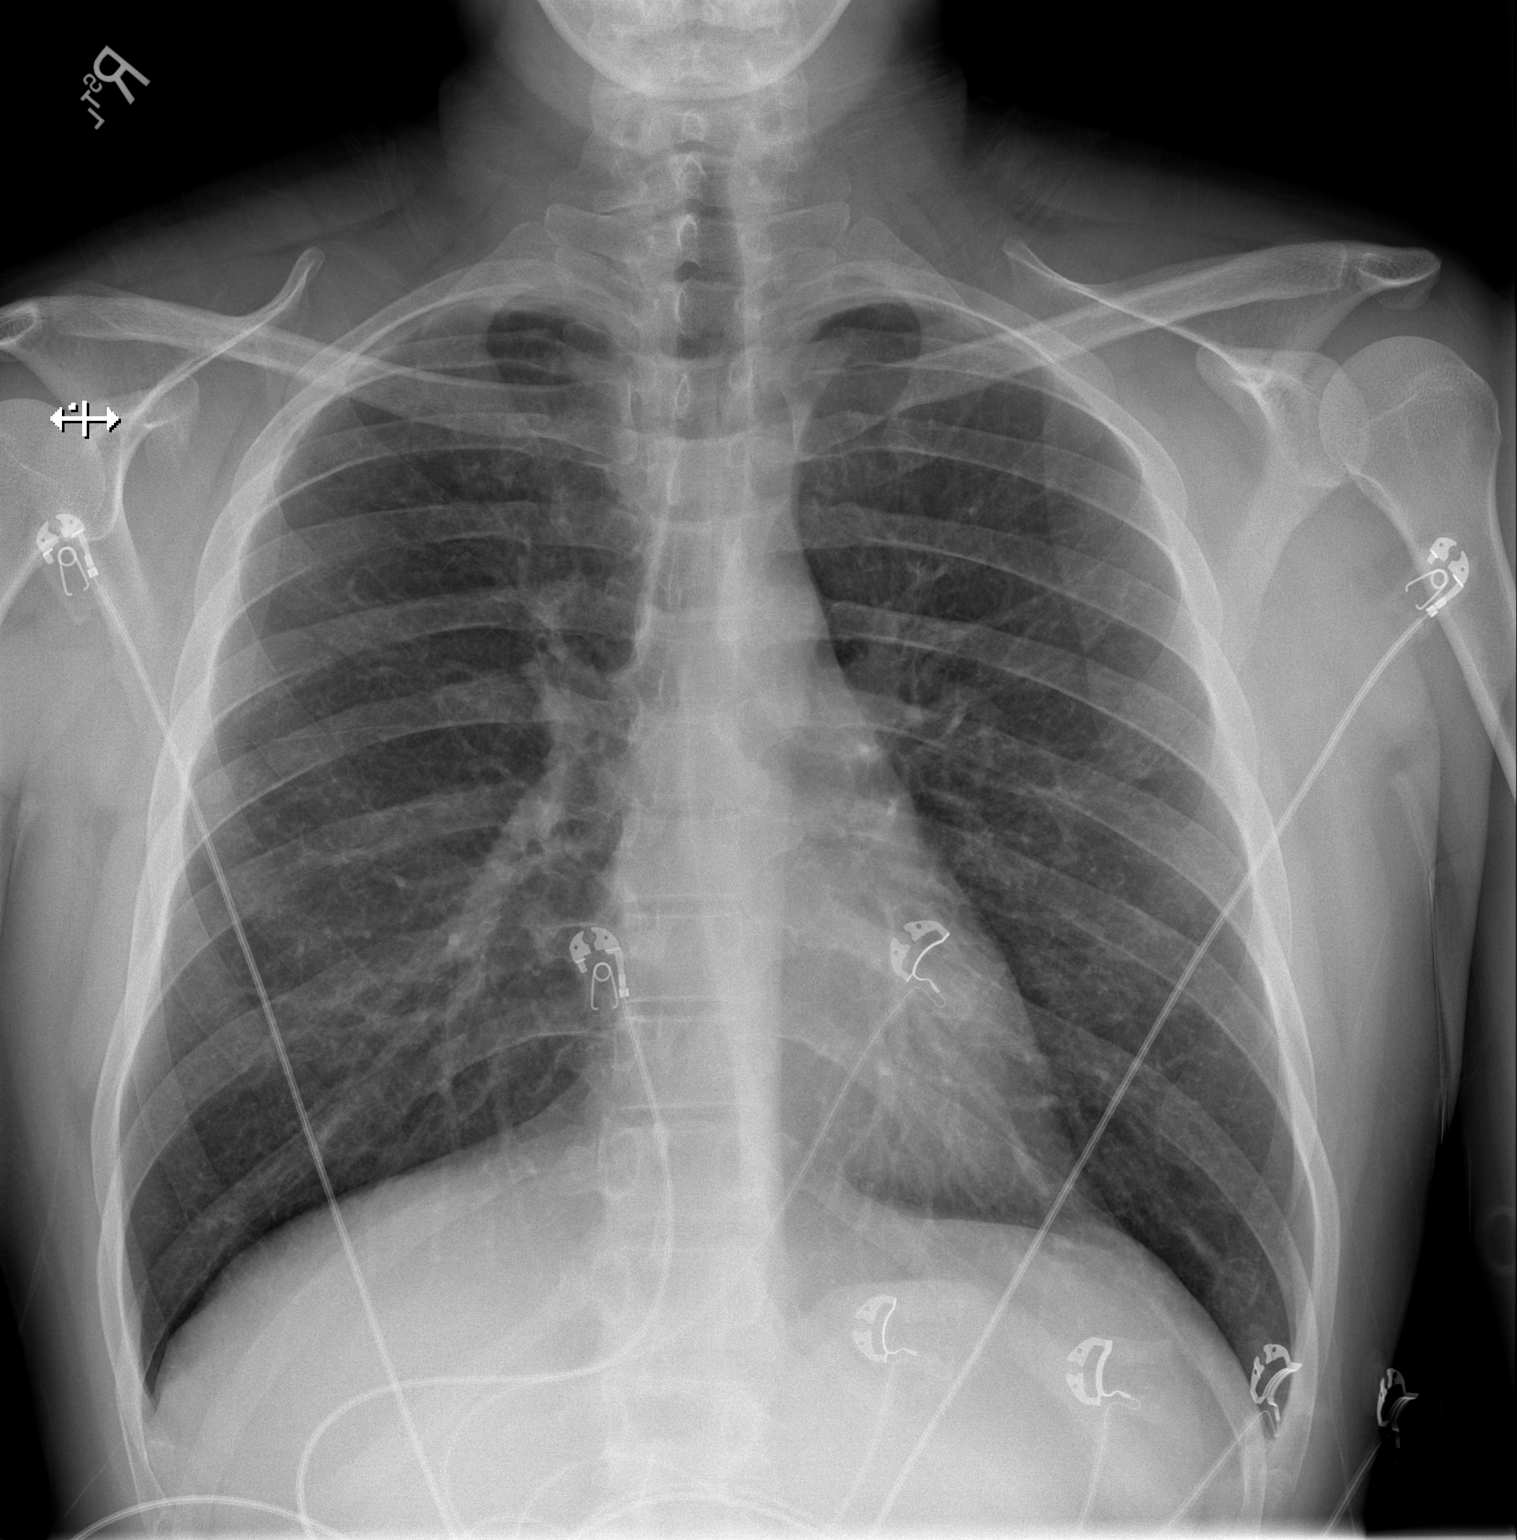

[w chest lat]
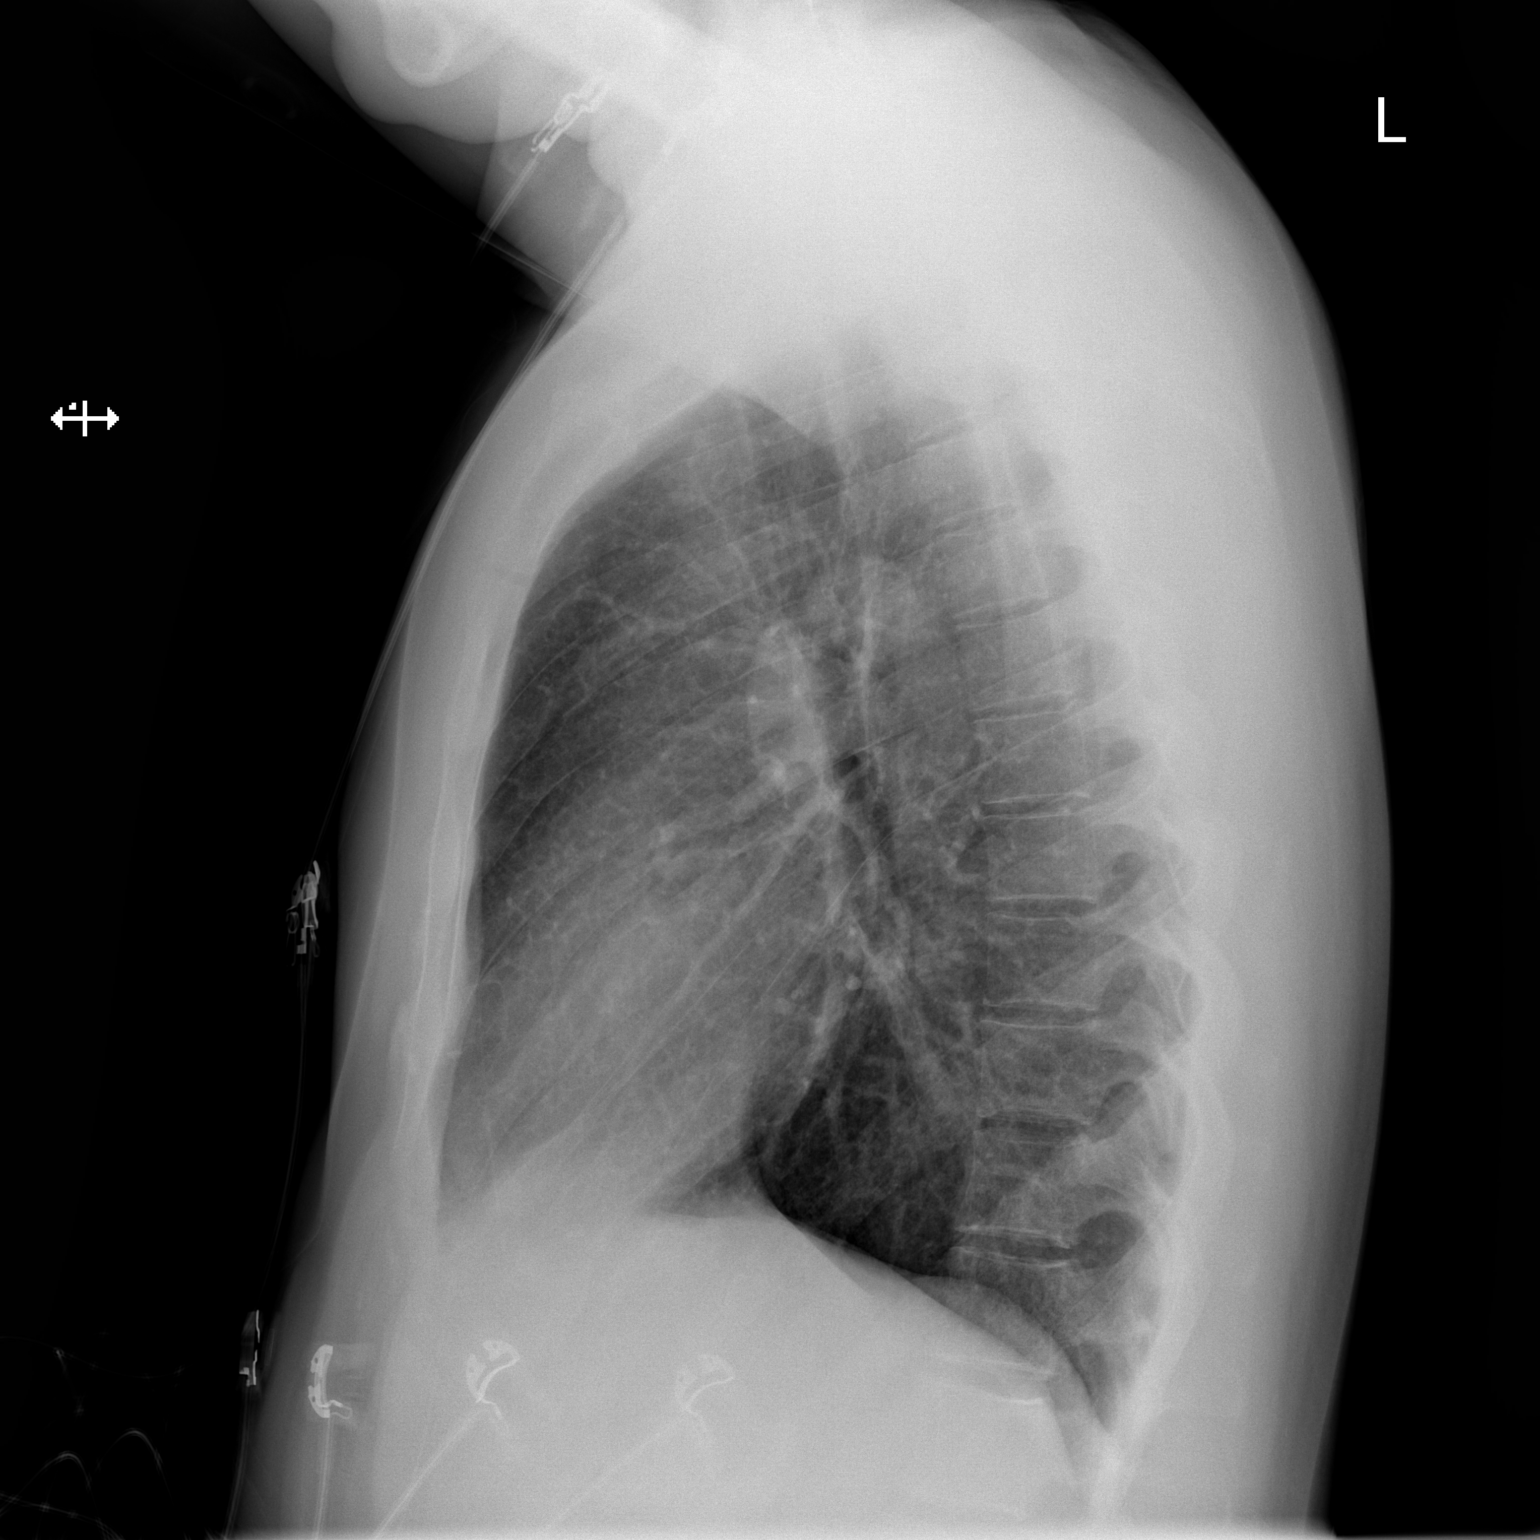

[2 of 2 positions shown; findings below may reference images not displayed]

FINDINGS: The heart size and mediastinal contours are within normal limits. No
consolidation, effusion, or pneumothorax. No acute osseous
abnormality.
IMPRESSION: No active cardiopulmonary disease.

## 2023-12-16 IMAGING — CT CT RENAL STONE PROTOCOL
2 of 4 series · 16 of 46 positions shown, 18 images · non-contrast
Comparison: 07/11/2016.

CLINICAL DATA: Left flank pain, kidney stone suspected.



[Series 2: axial st · axial · 0.86mm/px · z∈[-618,-233]mm · 13 of 89 slices shown, 15 images]
[im 6/89  soft-tissue]
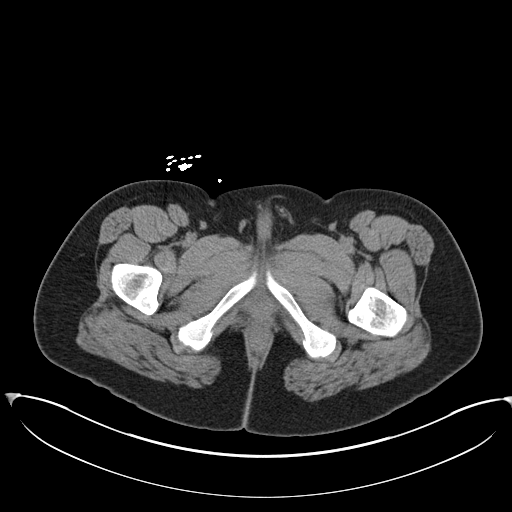
[im 6/89  bone]
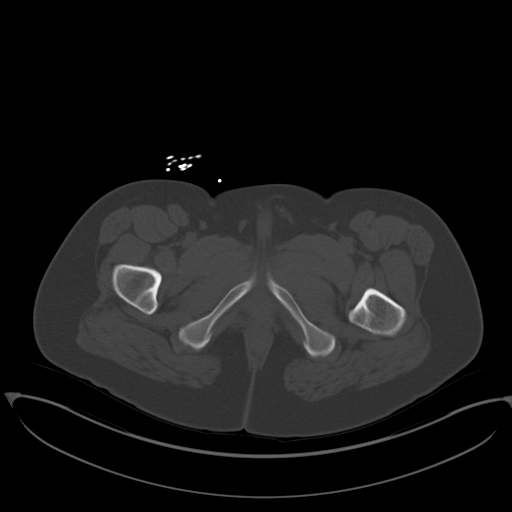
[im 11/89  soft-tissue]
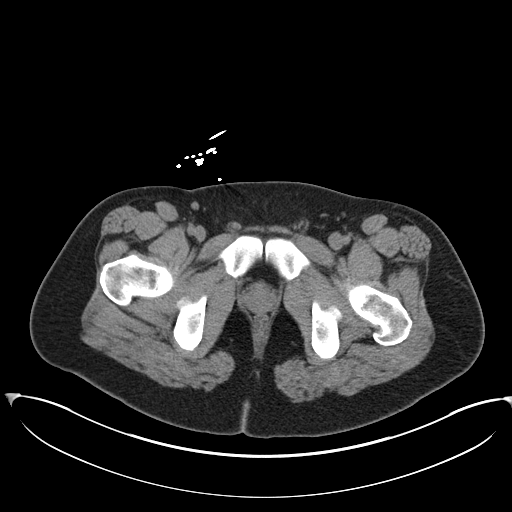
[im 21/89  soft-tissue]
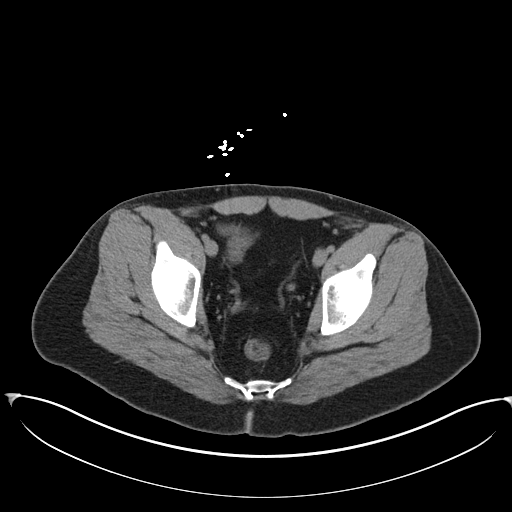
[im 26/89  soft-tissue]
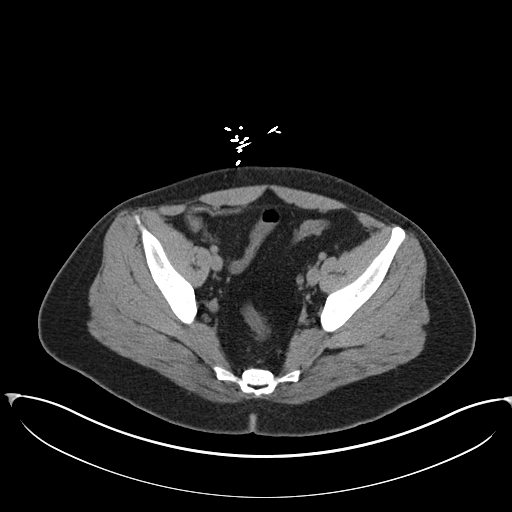
[im 32/89  soft-tissue]
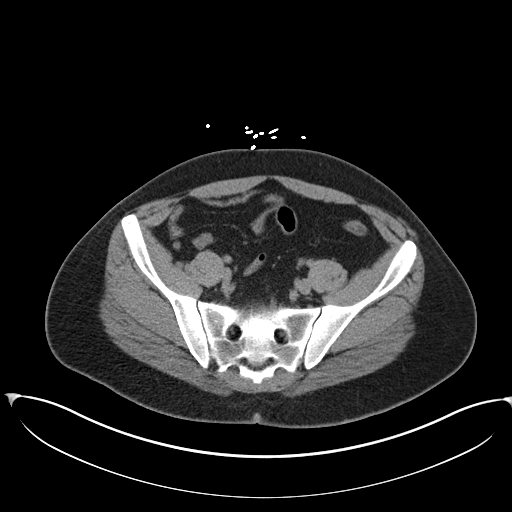
[im 37/89  soft-tissue]
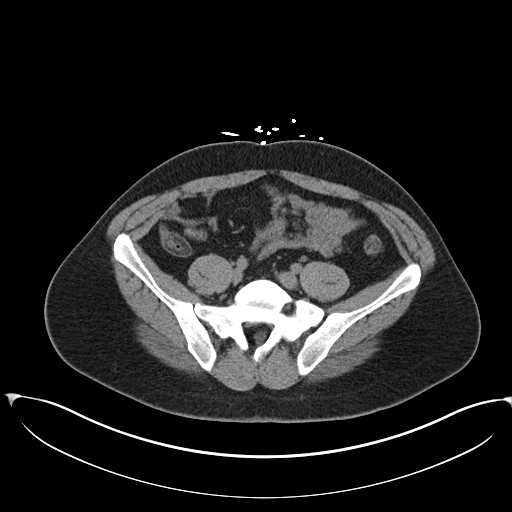
[im 47/89  soft-tissue]
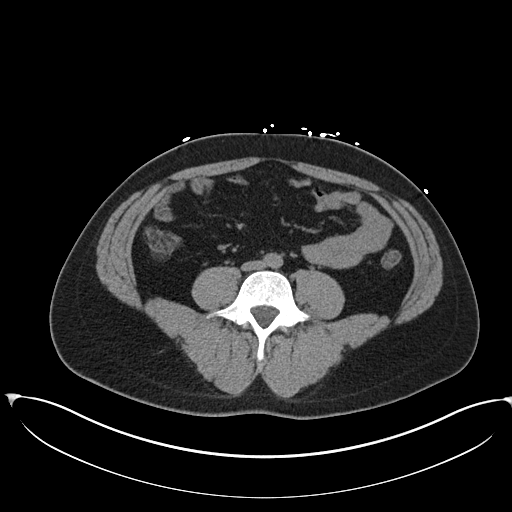
[im 52/89  soft-tissue]
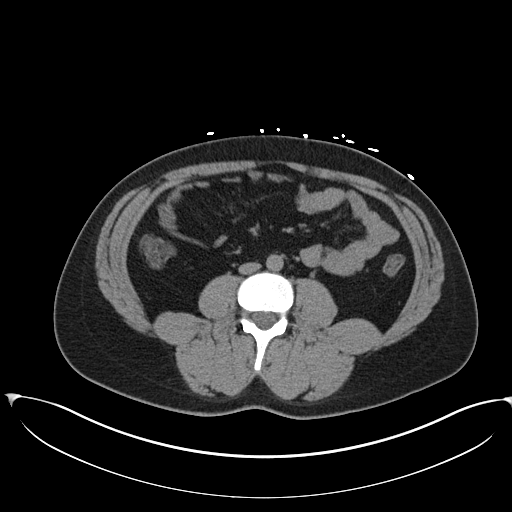
[im 57/89  soft-tissue]
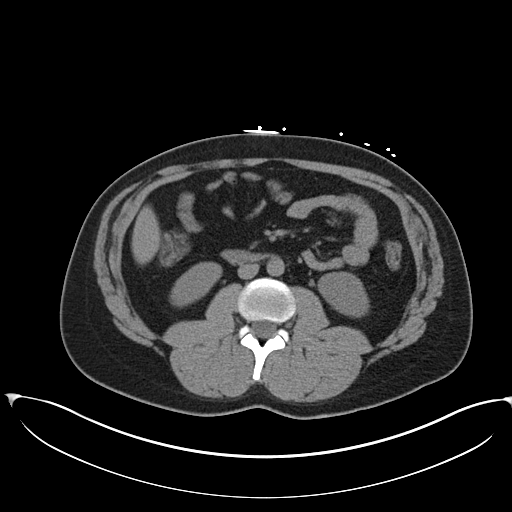
[im 57/89  bone]
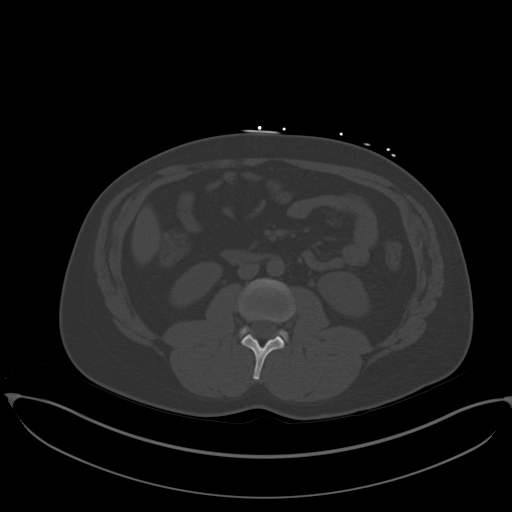
[im 63/89  soft-tissue]
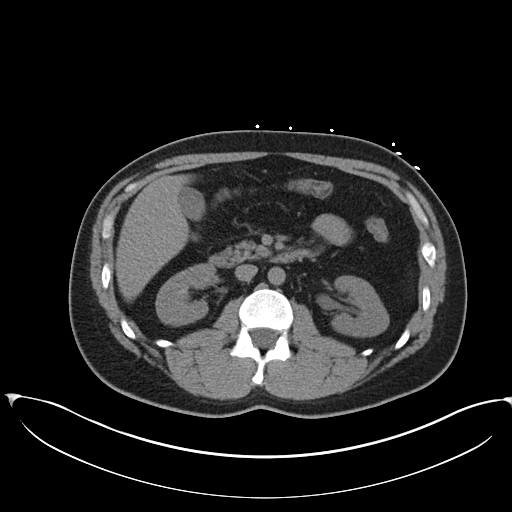
[im 68/89  soft-tissue]
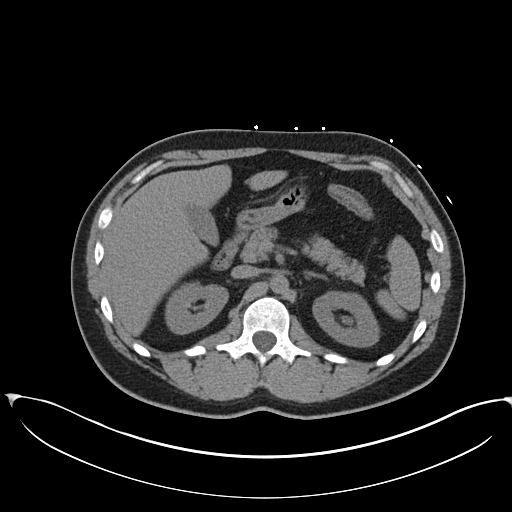
[im 78/89  soft-tissue]
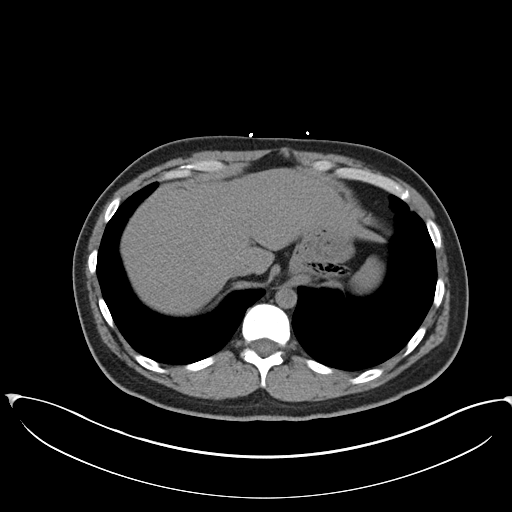
[im 83/89  soft-tissue]
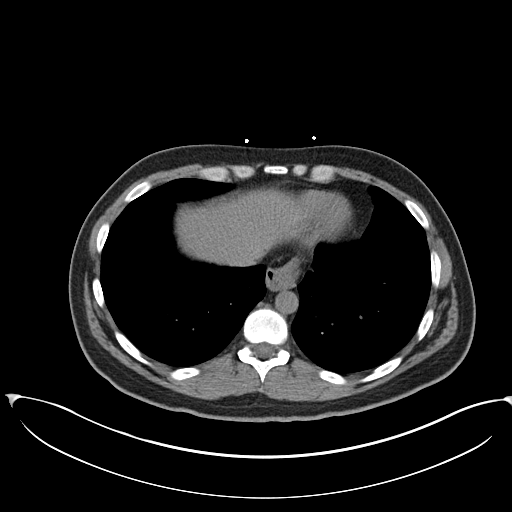

[Series 5: coronal · coronal · 0.89mm/px · 3 of 140 slices shown]
[im 47/140  soft-tissue]
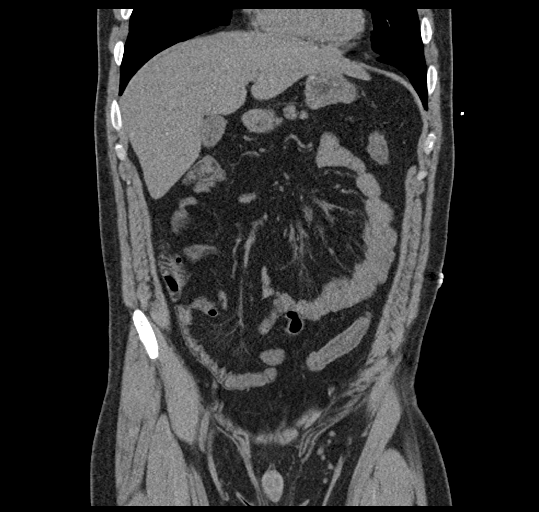
[im 62/140  soft-tissue]
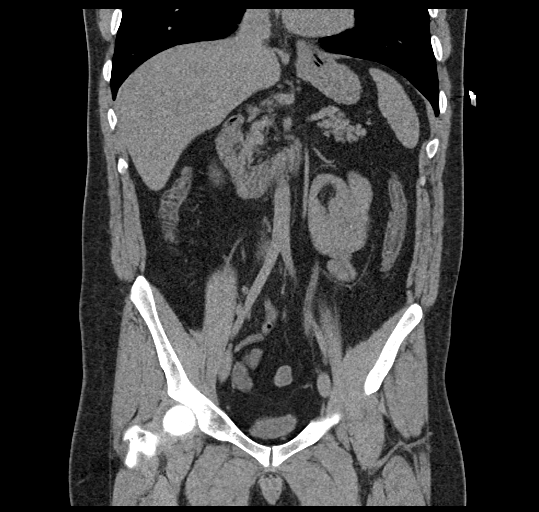
[im 78/140  soft-tissue]
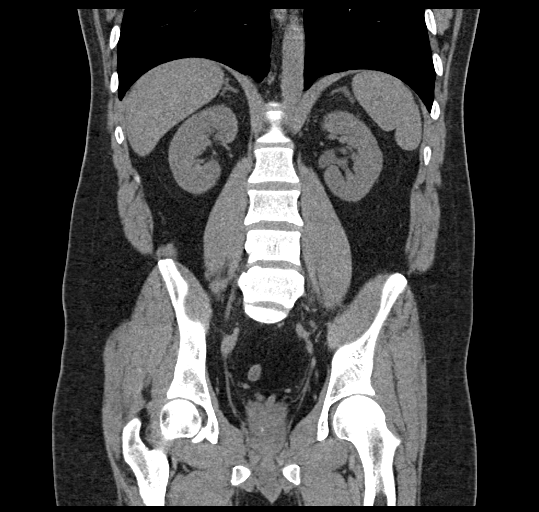

[16 of 46 positions shown; findings below may reference images not displayed]

FINDINGS: Lower chest: A 3 mm nodule is present in the left lower lobe, axial
image 11.

Hepatobiliary: No focal liver abnormality is seen. No gallstones,
gallbladder wall thickening, or biliary dilatation.

Pancreas: Unremarkable. No pancreatic ductal dilatation or
surrounding inflammatory changes.

Spleen: Normal in size without focal abnormality.

Adrenals/Urinary Tract: The adrenal glands are within normal limits.
There is a nonobstructive calculus in the left upper pole. The renal
pyramids are hyperdense bilaterally. There is mild obstructive
uropathy on the left with a 2 mm calculus in the distal left ureter
at the UVJ. No ureteral calculus on the right. The bladder is
unremarkable.

Stomach/Bowel: A small hiatal hernia is noted. Appendix appears
normal. No evidence of bowel wall thickening, distention, or
inflammatory changes. There is fatty infiltration of the walls of
the rectosigmoid colon and distal ileum, compatible with chronic
inflammatory changes.

Vascular/Lymphatic: No significant vascular findings are present. No
enlarged abdominal or pelvic lymph nodes.

Reproductive: Prostate is unremarkable.

Other: Small fat containing umbilical hernia.  No ascites.

Musculoskeletal: No acute osseous abnormality.
IMPRESSION: 1. Mild hydroureteronephrosis on the left with a 2 mm calculus in
the distal left ureter at the UVJ.
2. Nonobstructive left renal calculus.
3. Slightly hyperdense renal pyramids bilaterally, which may be
associated with medullary nephrocalcinosis.
4. Chronic inflammatory changes involving the rectosigmoid colon and
distal ileum.
5. Small hiatal hernia.
